# Patient Record
Sex: Male | Born: 1985
Health system: Southern US, Community
[De-identification: ages and names within clinical notes are randomized; demographics above are authoritative.]

## PROBLEM LIST (undated history)

## (undated) ENCOUNTER — Emergency Department (HOSPITAL_COMMUNITY): Admission: EM | Payer: Self-pay | Source: Home / Self Care

## (undated) DIAGNOSIS — F319 Bipolar disorder, unspecified: Secondary | ICD-10-CM

## (undated) DIAGNOSIS — J45909 Unspecified asthma, uncomplicated: Secondary | ICD-10-CM

## (undated) DIAGNOSIS — G4700 Insomnia, unspecified: Secondary | ICD-10-CM

## (undated) DIAGNOSIS — N179 Acute kidney failure, unspecified: Secondary | ICD-10-CM

## (undated) DIAGNOSIS — F988 Other specified behavioral and emotional disorders with onset usually occurring in childhood and adolescence: Secondary | ICD-10-CM

## (undated) HISTORY — PX: WISDOM TOOTH EXTRACTION: SHX21

---
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
## 2007-02-17 ENCOUNTER — Emergency Department (HOSPITAL_COMMUNITY): Admission: EM | Admit: 2007-02-17 | Discharge: 2007-02-18 | Payer: Self-pay | Admitting: Emergency Medicine

## 2007-03-01 ENCOUNTER — Emergency Department (HOSPITAL_COMMUNITY): Admission: EM | Admit: 2007-03-01 | Discharge: 2007-03-01 | Payer: Self-pay | Admitting: Emergency Medicine

## 2007-07-14 ENCOUNTER — Ambulatory Visit (HOSPITAL_COMMUNITY): Admission: RE | Admit: 2007-07-14 | Discharge: 2007-07-14 | Payer: Self-pay | Admitting: General Surgery

## 2007-12-27 ENCOUNTER — Emergency Department (HOSPITAL_COMMUNITY): Admission: EM | Admit: 2007-12-27 | Discharge: 2007-12-27 | Payer: Self-pay | Admitting: Emergency Medicine

## 2008-01-09 ENCOUNTER — Emergency Department (HOSPITAL_COMMUNITY): Admission: EM | Admit: 2008-01-09 | Discharge: 2008-01-09 | Payer: Self-pay | Admitting: Emergency Medicine

## 2008-02-19 ENCOUNTER — Emergency Department (HOSPITAL_COMMUNITY): Admission: EM | Admit: 2008-02-19 | Discharge: 2008-02-19 | Payer: Self-pay | Admitting: Emergency Medicine

## 2009-07-06 ENCOUNTER — Emergency Department (HOSPITAL_COMMUNITY): Admission: EM | Admit: 2009-07-06 | Discharge: 2009-07-07 | Payer: Self-pay | Admitting: Emergency Medicine

## 2009-09-14 ENCOUNTER — Emergency Department (HOSPITAL_COMMUNITY): Admission: EM | Admit: 2009-09-14 | Discharge: 2009-09-15 | Payer: Self-pay | Admitting: Emergency Medicine

## 2009-09-25 ENCOUNTER — Encounter: Admission: RE | Admit: 2009-09-25 | Discharge: 2009-09-25 | Payer: Self-pay | Admitting: Orthopaedic Surgery

## 2010-02-10 ENCOUNTER — Emergency Department (HOSPITAL_COMMUNITY): Admission: EM | Admit: 2010-02-10 | Discharge: 2010-02-10 | Payer: Self-pay | Admitting: Emergency Medicine

## 2011-01-20 LAB — GC/CHLAMYDIA PROBE AMP, GENITAL
Chlamydia, DNA Probe: POSITIVE — AB
GC Probe Amp, Genital: POSITIVE — AB

## 2011-01-20 LAB — RPR: RPR Ser Ql: NONREACTIVE

## 2011-05-13 DIAGNOSIS — J069 Acute upper respiratory infection, unspecified: Secondary | ICD-10-CM | POA: Diagnosis not present

## 2011-05-13 DIAGNOSIS — F17200 Nicotine dependence, unspecified, uncomplicated: Secondary | ICD-10-CM | POA: Diagnosis not present

## 2011-06-18 DIAGNOSIS — F3112 Bipolar disorder, current episode manic without psychotic features, moderate: Secondary | ICD-10-CM | POA: Diagnosis not present

## 2011-06-18 DIAGNOSIS — F5105 Insomnia due to other mental disorder: Secondary | ICD-10-CM | POA: Diagnosis not present

## 2011-06-18 DIAGNOSIS — F489 Nonpsychotic mental disorder, unspecified: Secondary | ICD-10-CM | POA: Diagnosis not present

## 2011-07-01 DIAGNOSIS — F3110 Bipolar disorder, current episode manic without psychotic features, unspecified: Secondary | ICD-10-CM | POA: Diagnosis not present

## 2011-07-27 DIAGNOSIS — F3110 Bipolar disorder, current episode manic without psychotic features, unspecified: Secondary | ICD-10-CM | POA: Diagnosis not present

## 2011-08-07 DIAGNOSIS — F3110 Bipolar disorder, current episode manic without psychotic features, unspecified: Secondary | ICD-10-CM | POA: Diagnosis not present

## 2011-08-28 DIAGNOSIS — F909 Attention-deficit hyperactivity disorder, unspecified type: Secondary | ICD-10-CM | POA: Diagnosis not present

## 2011-10-18 DIAGNOSIS — Z79899 Other long term (current) drug therapy: Secondary | ICD-10-CM | POA: Diagnosis not present

## 2011-10-18 DIAGNOSIS — S61409A Unspecified open wound of unspecified hand, initial encounter: Secondary | ICD-10-CM | POA: Diagnosis not present

## 2011-10-18 DIAGNOSIS — F17200 Nicotine dependence, unspecified, uncomplicated: Secondary | ICD-10-CM | POA: Diagnosis not present

## 2011-10-18 DIAGNOSIS — R7309 Other abnormal glucose: Secondary | ICD-10-CM | POA: Diagnosis not present

## 2011-10-18 DIAGNOSIS — Z23 Encounter for immunization: Secondary | ICD-10-CM | POA: Diagnosis not present

## 2011-11-01 DIAGNOSIS — Z4802 Encounter for removal of sutures: Secondary | ICD-10-CM | POA: Diagnosis not present

## 2012-01-06 DIAGNOSIS — F3110 Bipolar disorder, current episode manic without psychotic features, unspecified: Secondary | ICD-10-CM | POA: Diagnosis not present

## 2012-01-12 DIAGNOSIS — Z87891 Personal history of nicotine dependence: Secondary | ICD-10-CM | POA: Diagnosis not present

## 2012-01-12 DIAGNOSIS — R918 Other nonspecific abnormal finding of lung field: Secondary | ICD-10-CM | POA: Diagnosis not present

## 2012-01-12 DIAGNOSIS — R7309 Other abnormal glucose: Secondary | ICD-10-CM | POA: Diagnosis present

## 2012-01-12 DIAGNOSIS — F319 Bipolar disorder, unspecified: Secondary | ICD-10-CM | POA: Diagnosis present

## 2012-01-12 DIAGNOSIS — Z888 Allergy status to other drugs, medicaments and biological substances status: Secondary | ICD-10-CM | POA: Diagnosis not present

## 2012-01-12 DIAGNOSIS — R079 Chest pain, unspecified: Secondary | ICD-10-CM | POA: Diagnosis not present

## 2012-01-12 DIAGNOSIS — J180 Bronchopneumonia, unspecified organism: Secondary | ICD-10-CM | POA: Diagnosis not present

## 2012-01-12 DIAGNOSIS — R042 Hemoptysis: Secondary | ICD-10-CM | POA: Diagnosis not present

## 2012-01-12 DIAGNOSIS — I2699 Other pulmonary embolism without acute cor pulmonale: Secondary | ICD-10-CM | POA: Diagnosis not present

## 2012-01-12 DIAGNOSIS — Z79899 Other long term (current) drug therapy: Secondary | ICD-10-CM | POA: Diagnosis not present

## 2012-01-12 DIAGNOSIS — G4700 Insomnia, unspecified: Secondary | ICD-10-CM | POA: Diagnosis present

## 2012-01-12 DIAGNOSIS — F909 Attention-deficit hyperactivity disorder, unspecified type: Secondary | ICD-10-CM | POA: Diagnosis present

## 2012-01-12 DIAGNOSIS — J189 Pneumonia, unspecified organism: Secondary | ICD-10-CM | POA: Diagnosis present

## 2012-01-13 DIAGNOSIS — R918 Other nonspecific abnormal finding of lung field: Secondary | ICD-10-CM | POA: Diagnosis not present

## 2012-01-13 DIAGNOSIS — R042 Hemoptysis: Secondary | ICD-10-CM | POA: Diagnosis not present

## 2012-01-13 DIAGNOSIS — J180 Bronchopneumonia, unspecified organism: Secondary | ICD-10-CM | POA: Diagnosis not present

## 2012-01-13 DIAGNOSIS — R079 Chest pain, unspecified: Secondary | ICD-10-CM | POA: Diagnosis not present

## 2012-01-21 DIAGNOSIS — J159 Unspecified bacterial pneumonia: Secondary | ICD-10-CM | POA: Diagnosis not present

## 2012-03-11 DIAGNOSIS — Z79899 Other long term (current) drug therapy: Secondary | ICD-10-CM | POA: Diagnosis not present

## 2012-03-11 DIAGNOSIS — K089 Disorder of teeth and supporting structures, unspecified: Secondary | ICD-10-CM | POA: Diagnosis not present

## 2012-03-11 DIAGNOSIS — K029 Dental caries, unspecified: Secondary | ICD-10-CM | POA: Diagnosis not present

## 2012-03-11 DIAGNOSIS — F17200 Nicotine dependence, unspecified, uncomplicated: Secondary | ICD-10-CM | POA: Diagnosis not present

## 2012-03-14 DIAGNOSIS — F17200 Nicotine dependence, unspecified, uncomplicated: Secondary | ICD-10-CM | POA: Diagnosis not present

## 2012-03-14 DIAGNOSIS — I959 Hypotension, unspecified: Secondary | ICD-10-CM | POA: Diagnosis not present

## 2012-03-14 DIAGNOSIS — K5289 Other specified noninfective gastroenteritis and colitis: Secondary | ICD-10-CM | POA: Diagnosis not present

## 2012-03-14 DIAGNOSIS — F411 Generalized anxiety disorder: Secondary | ICD-10-CM | POA: Diagnosis not present

## 2012-03-14 DIAGNOSIS — F909 Attention-deficit hyperactivity disorder, unspecified type: Secondary | ICD-10-CM | POA: Diagnosis not present

## 2012-03-14 DIAGNOSIS — F209 Schizophrenia, unspecified: Secondary | ICD-10-CM | POA: Diagnosis not present

## 2012-03-14 DIAGNOSIS — D649 Anemia, unspecified: Secondary | ICD-10-CM | POA: Diagnosis not present

## 2012-03-14 DIAGNOSIS — F319 Bipolar disorder, unspecified: Secondary | ICD-10-CM | POA: Diagnosis not present

## 2012-03-14 DIAGNOSIS — R480 Dyslexia and alexia: Secondary | ICD-10-CM | POA: Diagnosis not present

## 2012-03-14 DIAGNOSIS — Z79899 Other long term (current) drug therapy: Secondary | ICD-10-CM | POA: Diagnosis not present

## 2012-03-14 DIAGNOSIS — I10 Essential (primary) hypertension: Secondary | ICD-10-CM | POA: Diagnosis not present

## 2012-03-14 DIAGNOSIS — E876 Hypokalemia: Secondary | ICD-10-CM | POA: Diagnosis not present

## 2012-03-14 DIAGNOSIS — R112 Nausea with vomiting, unspecified: Secondary | ICD-10-CM | POA: Diagnosis not present

## 2012-03-14 DIAGNOSIS — Z888 Allergy status to other drugs, medicaments and biological substances status: Secondary | ICD-10-CM | POA: Diagnosis not present

## 2012-03-14 DIAGNOSIS — N179 Acute kidney failure, unspecified: Secondary | ICD-10-CM | POA: Diagnosis not present

## 2012-03-14 DIAGNOSIS — R109 Unspecified abdominal pain: Secondary | ICD-10-CM | POA: Diagnosis not present

## 2012-03-14 DIAGNOSIS — Z841 Family history of disorders of kidney and ureter: Secondary | ICD-10-CM | POA: Diagnosis not present

## 2012-03-14 DIAGNOSIS — M549 Dorsalgia, unspecified: Secondary | ICD-10-CM | POA: Diagnosis not present

## 2012-03-15 DIAGNOSIS — K5289 Other specified noninfective gastroenteritis and colitis: Secondary | ICD-10-CM | POA: Diagnosis not present

## 2012-03-16 DIAGNOSIS — R112 Nausea with vomiting, unspecified: Secondary | ICD-10-CM | POA: Diagnosis not present

## 2012-03-16 DIAGNOSIS — K5289 Other specified noninfective gastroenteritis and colitis: Secondary | ICD-10-CM | POA: Diagnosis not present

## 2012-03-16 DIAGNOSIS — R110 Nausea: Secondary | ICD-10-CM | POA: Diagnosis not present

## 2012-03-16 DIAGNOSIS — N179 Acute kidney failure, unspecified: Secondary | ICD-10-CM | POA: Diagnosis not present

## 2012-03-16 DIAGNOSIS — F329 Major depressive disorder, single episode, unspecified: Secondary | ICD-10-CM | POA: Diagnosis not present

## 2012-03-17 DIAGNOSIS — R112 Nausea with vomiting, unspecified: Secondary | ICD-10-CM | POA: Diagnosis not present

## 2012-03-17 DIAGNOSIS — F329 Major depressive disorder, single episode, unspecified: Secondary | ICD-10-CM | POA: Diagnosis not present

## 2012-03-17 DIAGNOSIS — K5289 Other specified noninfective gastroenteritis and colitis: Secondary | ICD-10-CM | POA: Diagnosis not present

## 2012-03-17 DIAGNOSIS — N179 Acute kidney failure, unspecified: Secondary | ICD-10-CM | POA: Diagnosis not present

## 2012-03-18 DIAGNOSIS — F329 Major depressive disorder, single episode, unspecified: Secondary | ICD-10-CM | POA: Diagnosis not present

## 2012-03-18 DIAGNOSIS — N179 Acute kidney failure, unspecified: Secondary | ICD-10-CM | POA: Diagnosis not present

## 2012-03-18 DIAGNOSIS — K5289 Other specified noninfective gastroenteritis and colitis: Secondary | ICD-10-CM | POA: Diagnosis not present

## 2012-03-18 DIAGNOSIS — R112 Nausea with vomiting, unspecified: Secondary | ICD-10-CM | POA: Diagnosis not present

## 2012-07-03 ENCOUNTER — Encounter (HOSPITAL_COMMUNITY): Payer: Self-pay | Admitting: Emergency Medicine

## 2012-07-03 ENCOUNTER — Emergency Department (HOSPITAL_COMMUNITY)
Admission: EM | Admit: 2012-07-03 | Discharge: 2012-07-03 | Disposition: A | Discharge status: Home / Self Care | Payer: Medicare Other | Attending: Emergency Medicine | Admitting: Emergency Medicine

## 2012-07-03 ENCOUNTER — Emergency Department (HOSPITAL_COMMUNITY): Payer: Medicare Other

## 2012-07-03 DIAGNOSIS — R109 Unspecified abdominal pain: Secondary | ICD-10-CM | POA: Diagnosis not present

## 2012-07-03 DIAGNOSIS — R1110 Vomiting, unspecified: Secondary | ICD-10-CM | POA: Diagnosis not present

## 2012-07-03 DIAGNOSIS — R197 Diarrhea, unspecified: Secondary | ICD-10-CM | POA: Diagnosis not present

## 2012-07-03 DIAGNOSIS — R1031 Right lower quadrant pain: Secondary | ICD-10-CM | POA: Diagnosis not present

## 2012-07-03 DIAGNOSIS — F17200 Nicotine dependence, unspecified, uncomplicated: Secondary | ICD-10-CM | POA: Diagnosis not present

## 2012-07-03 DIAGNOSIS — J45909 Unspecified asthma, uncomplicated: Secondary | ICD-10-CM | POA: Insufficient documentation

## 2012-07-03 HISTORY — DX: Unspecified asthma, uncomplicated: J45.909

## 2012-07-03 LAB — URINALYSIS, ROUTINE W REFLEX MICROSCOPIC
Glucose, UA: NEGATIVE mg/dL
Hgb urine dipstick: NEGATIVE
Ketones, ur: NEGATIVE mg/dL
Leukocytes, UA: NEGATIVE
Nitrite: NEGATIVE
Protein, ur: NEGATIVE mg/dL
Specific Gravity, Urine: >1.03 — ABNORMAL HIGH (ref 1.005–1.030)
Urobilinogen, UA: 0.2 mg/dL (ref 0.0–1.0)
pH: 6 (ref 5.0–8.0)

## 2012-07-03 LAB — CBC WITH DIFFERENTIAL/PLATELET
Basophils Absolute: 0 10*3/uL (ref 0.0–0.1)
Basophils Relative: 0 % (ref 0–1)
Eosinophils Absolute: 0.1 10*3/uL (ref 0.0–0.7)
Eosinophils Relative: 2 % (ref 0–5)
HCT: 45.9 % (ref 39.0–52.0)
Hemoglobin: 15.4 g/dL (ref 13.0–17.0)
Lymphocytes Relative: 40 % (ref 12–46)
Lymphs Abs: 2 10*3/uL (ref 0.7–4.0)
MCH: 28.4 pg (ref 26.0–34.0)
MCHC: 33.6 g/dL (ref 30.0–36.0)
MCV: 84.7 fL (ref 78.0–100.0)
Monocytes Absolute: 0.4 10*3/uL (ref 0.1–1.0)
Monocytes Relative: 8 % (ref 3–12)
Neutro Abs: 2.5 10*3/uL (ref 1.7–7.7)
Neutrophils Relative %: 50 % (ref 43–77)
Platelets: 196 10*3/uL (ref 150–400)
RBC: 5.42 MIL/uL (ref 4.22–5.81)
RDW: 13 % (ref 11.5–15.5)
WBC: 5 10*3/uL (ref 4.0–10.5)

## 2012-07-03 LAB — LIPASE, BLOOD: Lipase: 13 U/L (ref 11–59)

## 2012-07-03 LAB — BASIC METABOLIC PANEL
BUN: 8 mg/dL (ref 6–23)
CO2: 28 mEq/L (ref 19–32)
Calcium: 9.6 mg/dL (ref 8.4–10.5)
Chloride: 103 mEq/L (ref 96–112)
Creatinine, Ser: 1.04 mg/dL (ref 0.50–1.35)
GFR calc Af Amer: >90 mL/min (ref >90)
GFR calc non Af Amer: >90 mL/min (ref >90)
Glucose, Bld: 86 mg/dL (ref 70–99)
Potassium: 4.4 mEq/L (ref 3.5–5.1)
Sodium: 138 mEq/L (ref 135–145)

## 2012-07-03 MED ORDER — IOHEXOL 300 MG/ML  SOLN
100.0000 mL | Freq: Once | INTRAMUSCULAR | Status: AC | PRN
  Administered 2012-07-03: 100 mL via INTRAVENOUS

## 2012-07-03 MED ORDER — ONDANSETRON HCL 4 MG/2ML IJ SOLN
4.0000 mg | Freq: Once | INTRAMUSCULAR | Status: AC
  Administered 2012-07-03: 4 mg via INTRAVENOUS
  Filled 2012-07-03: qty 2

## 2012-07-03 MED ORDER — HYDROMORPHONE HCL PF 1 MG/ML IJ SOLN
1.0000 mg | Freq: Once | INTRAMUSCULAR | Status: AC
  Administered 2012-07-03: 1 mg via INTRAVENOUS
  Filled 2012-07-03: qty 1

## 2012-07-03 MED ORDER — SODIUM CHLORIDE 0.9 % IV BOLUS (SEPSIS)
1000.0000 mL | Freq: Once | INTRAVENOUS | Status: AC
  Administered 2012-07-03: 1000 mL via INTRAVENOUS

## 2012-07-03 MED ORDER — IOHEXOL 300 MG/ML  SOLN
50.0000 mL | Freq: Once | INTRAMUSCULAR | Status: AC | PRN
  Administered 2012-07-03: 50 mL via ORAL

## 2012-07-03 MED ORDER — TRAMADOL HCL 50 MG PO TABS: 50.0000 mg | ORAL_TABLET | Freq: Four times a day (QID) | ORAL | Status: DC | PRN

## 2012-07-03 NOTE — ED Notes (Signed)
Pt c/o abd pain with nausea. denies vomiting. Some diarrhea.

## 2012-07-07 NOTE — ED Provider Notes (Signed)
History    27 year old male with abdominal pain. Onset last night. Constant since. No appreciable exacerbating relieving factors. Pain is from the umbilicus. Does not radiate. Nausea, but no vomiting. Some loose stools. No blood or melena. No sick contacts. History of asthma, otherwise healthy. No past surgical history. No intervention prior to arrival.  CSN: [id number]  Arrival date & time 07/03/12  1132   First MD Initiated Contact with Patient 07/03/12 1412      Chief Complaint  Patient presents with  . Abdominal Pain    (Consider location/radiation/quality/duration/timing/severity/associated sxs/prior treatment) HPI  Past Medical History  Diagnosis Date  . Asthma     Past Surgical History  Procedure Laterality Date  . Wisdom tooth extraction      History reviewed. No pertinent family history.  History  Substance Use Topics  . Smoking status: Current Every Day Smoker  . Smokeless tobacco: Not on file  . Alcohol Use: No      Review of Systems  All systems reviewed and negative, other than as noted in HPI.   Allergies  Fish allergy and Penicillins  Home Medications   Current Outpatient Rx  Name  Route  Sig  Dispense  Refill  . traMADol (ULTRAM) 50 MG tablet   Oral   Take 1 tablet (50 mg total) by mouth every 6 (six) hours as needed for pain.   15 tablet   0     BP 107/71  Pulse 52  Temp(Src) 97.5 F (36.4 C) (Oral)  Resp 14  Ht 6' 2.25" (1.886 m)  Wt 236 lb 4 oz (107.162 kg)  BMI 30.13 kg/m2  SpO2 100%  Physical Exam  Nursing note and vitals reviewed. Constitutional: He appears well-developed and well-nourished. No distress.  HENT:  Head: Normocephalic and atraumatic.  Eyes: Conjunctivae are normal. Right eye exhibits no discharge. Left eye exhibits no discharge.  Neck: Neck supple.  Cardiovascular: Normal rate, regular rhythm and normal heart sounds.  Exam reveals no gallop and no friction rub.   No murmur heard. Pulmonary/Chest: Effort  normal and breath sounds normal. No respiratory distress.  Abdominal: Soft. He exhibits no distension and no mass. There is tenderness. There is no rebound and no guarding.  Right lower quadrant/suprapubic tenderness without rebound or guarding. No distention.  Genitourinary:  No costovertebral angle tenderness  Musculoskeletal: He exhibits no edema and no tenderness.  Neurological: He is alert.  Skin: Skin is warm and dry.  Psychiatric: He has a normal mood and affect. His behavior is normal. Thought content normal.    ED Course  Procedures (including critical care time)  Labs Reviewed  URINALYSIS, ROUTINE W REFLEX MICROSCOPIC - Abnormal; Notable for the following:    APPearance HAZY (*)    Specific Gravity, Urine >1.030 (*)    Bilirubin Urine SMALL (*)    All other components within normal limits  CBC WITH DIFFERENTIAL  BASIC METABOLIC PANEL  LIPASE, BLOOD   No results found.  Ct Abdomen Pelvis W Contrast  07/03/2012  *RADIOLOGY REPORT*  Clinical Data: Abdominal pain.  Vomiting.  CT ABDOMEN AND PELVIS WITH CONTRAST  Technique:  Multidetector CT imaging of the abdomen and pelvis was performed following the standard protocol during bolus administration of intravenous contrast.  Contrast: 100 ml Omnipaque-300  Comparison: None.  Findings: The abdominal parenchymal organs are normal in appearance.  Gallbladder is unremarkable.  No evidence of biliary dilatation.  No evidence of hydronephrosis.  No soft tissue masses or lymphadenopathy identified within the abdomen  or pelvis.  No evidence of inflammatory process or abnormal fluid collections. Normal appendix is visualized.  No evidence of bowel wall thickening, dilatation, or hernia.  IMPRESSION: Negative.  No acute findings or other significant abnormality identified.   Original Report Authenticated By: Myles Rosenthal, M.D.    1. Abdominal pain       MDM  27 year old male with abdominal pain. Workup was unremarkable including CT abdomen  and pelvis. Etiology not clear, but I have a low suspicion for emergent process. He is hemodynamically stable. Feels safe for discharge at this time. Return precautions discussed. Outpatient followup otherwise.        Raeford Razor, MD 07/07/12 [phone]

## 2012-08-07 DIAGNOSIS — T148XXA Other injury of unspecified body region, initial encounter: Secondary | ICD-10-CM | POA: Diagnosis not present

## 2012-08-07 DIAGNOSIS — M79609 Pain in unspecified limb: Secondary | ICD-10-CM | POA: Diagnosis not present

## 2012-08-07 DIAGNOSIS — S199XXA Unspecified injury of neck, initial encounter: Secondary | ICD-10-CM | POA: Diagnosis not present

## 2012-08-07 DIAGNOSIS — S139XXA Sprain of joints and ligaments of unspecified parts of neck, initial encounter: Secondary | ICD-10-CM | POA: Diagnosis not present

## 2012-08-07 DIAGNOSIS — Z79899 Other long term (current) drug therapy: Secondary | ICD-10-CM | POA: Diagnosis not present

## 2012-08-07 DIAGNOSIS — S0990XA Unspecified injury of head, initial encounter: Secondary | ICD-10-CM | POA: Diagnosis not present

## 2012-08-07 DIAGNOSIS — M25519 Pain in unspecified shoulder: Secondary | ICD-10-CM | POA: Diagnosis not present

## 2012-08-07 DIAGNOSIS — F17200 Nicotine dependence, unspecified, uncomplicated: Secondary | ICD-10-CM | POA: Diagnosis not present

## 2012-08-07 DIAGNOSIS — S3981XA Other specified injuries of abdomen, initial encounter: Secondary | ICD-10-CM | POA: Diagnosis not present

## 2012-08-07 DIAGNOSIS — S298XXA Other specified injuries of thorax, initial encounter: Secondary | ICD-10-CM | POA: Diagnosis not present

## 2012-08-07 DIAGNOSIS — S0993XA Unspecified injury of face, initial encounter: Secondary | ICD-10-CM | POA: Diagnosis not present

## 2012-09-05 DIAGNOSIS — M25519 Pain in unspecified shoulder: Secondary | ICD-10-CM | POA: Diagnosis not present

## 2012-11-25 DIAGNOSIS — A5903 Trichomonal cystitis and urethritis: Secondary | ICD-10-CM | POA: Diagnosis not present

## 2012-11-30 ENCOUNTER — Encounter: Payer: Self-pay | Admitting: Orthopedic Surgery

## 2012-11-30 ENCOUNTER — Ambulatory Visit (INDEPENDENT_AMBULATORY_CARE_PROVIDER_SITE_OTHER): Payer: Medicare Other | Admitting: Orthopedic Surgery

## 2012-11-30 VITALS — BP 107/75 | Ht 74.0 in | Wt 251.0 lb

## 2012-11-30 DIAGNOSIS — M542 Cervicalgia: Secondary | ICD-10-CM | POA: Diagnosis not present

## 2012-11-30 DIAGNOSIS — M75120 Complete rotator cuff tear or rupture of unspecified shoulder, not specified as traumatic: Secondary | ICD-10-CM

## 2012-11-30 DIAGNOSIS — M75122 Complete rotator cuff tear or rupture of left shoulder, not specified as traumatic: Secondary | ICD-10-CM

## 2012-11-30 MED ORDER — HYDROCODONE-ACETAMINOPHEN 5-325 MG PO TABS: 1.0000 | ORAL_TABLET | ORAL | Status: DC | PRN

## 2012-11-30 MED ORDER — IBUPROFEN 800 MG PO TABS: 800.0000 mg | ORAL_TABLET | Freq: Three times a day (TID) | ORAL | Status: DC

## 2012-11-30 NOTE — Patient Instructions (Addendum)
MRI left shoulder followup in 3 weeks take medications as ordered
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
## 2012-12-08 ENCOUNTER — Telehealth: Payer: Self-pay | Admitting: *Deleted

## 2012-12-08 DIAGNOSIS — F3132 Bipolar disorder, current episode depressed, moderate: Secondary | ICD-10-CM | POA: Diagnosis not present

## 2012-12-08 NOTE — Telephone Encounter (Signed)
MRI scheduled for 12/14/12 at 2:00 pm No precert required per Medicare guidelines and No precert required per Layla Maw with [place] [employer] NPI # for [facility] Dr. Felecia Shelling [id number] Follow up appointment with Dr. Romeo Apple is 12/22/12 at 4:30pm Dates and times of MRI given to his mother Gregory Cox

## 2012-12-14 ENCOUNTER — Ambulatory Visit (HOSPITAL_COMMUNITY): Payer: Medicare Other

## 2012-12-15 ENCOUNTER — Emergency Department (HOSPITAL_COMMUNITY): Payer: Medicare Other

## 2012-12-15 ENCOUNTER — Encounter (HOSPITAL_COMMUNITY): Payer: Self-pay

## 2012-12-15 ENCOUNTER — Emergency Department (HOSPITAL_COMMUNITY)
Admission: EM | Admit: 2012-12-15 | Discharge: 2012-12-15 | Disposition: A | Discharge status: Home / Self Care | Payer: Medicare Other | Attending: Emergency Medicine | Admitting: Emergency Medicine

## 2012-12-15 DIAGNOSIS — J45909 Unspecified asthma, uncomplicated: Secondary | ICD-10-CM | POA: Insufficient documentation

## 2012-12-15 DIAGNOSIS — F17200 Nicotine dependence, unspecified, uncomplicated: Secondary | ICD-10-CM | POA: Insufficient documentation

## 2012-12-15 DIAGNOSIS — Z880 Allergy status to penicillin: Secondary | ICD-10-CM | POA: Insufficient documentation

## 2012-12-15 DIAGNOSIS — S93409A Sprain of unspecified ligament of unspecified ankle, initial encounter: Secondary | ICD-10-CM | POA: Diagnosis not present

## 2012-12-15 DIAGNOSIS — X500XXA Overexertion from strenuous movement or load, initial encounter: Secondary | ICD-10-CM | POA: Insufficient documentation

## 2012-12-15 DIAGNOSIS — M7989 Other specified soft tissue disorders: Secondary | ICD-10-CM | POA: Diagnosis not present

## 2012-12-15 DIAGNOSIS — Z791 Long term (current) use of non-steroidal anti-inflammatories (NSAID): Secondary | ICD-10-CM | POA: Insufficient documentation

## 2012-12-15 DIAGNOSIS — F988 Other specified behavioral and emotional disorders with onset usually occurring in childhood and adolescence: Secondary | ICD-10-CM | POA: Insufficient documentation

## 2012-12-15 DIAGNOSIS — S93401A Sprain of unspecified ligament of right ankle, initial encounter: Secondary | ICD-10-CM

## 2012-12-15 DIAGNOSIS — Z79899 Other long term (current) drug therapy: Secondary | ICD-10-CM | POA: Diagnosis not present

## 2012-12-15 DIAGNOSIS — M25579 Pain in unspecified ankle and joints of unspecified foot: Secondary | ICD-10-CM | POA: Diagnosis not present

## 2012-12-15 DIAGNOSIS — Y9239 Other specified sports and athletic area as the place of occurrence of the external cause: Secondary | ICD-10-CM | POA: Insufficient documentation

## 2012-12-15 DIAGNOSIS — Y9367 Activity, basketball: Secondary | ICD-10-CM | POA: Insufficient documentation

## 2012-12-15 HISTORY — DX: Other specified behavioral and emotional disorders with onset usually occurring in childhood and adolescence: F98.8

## 2012-12-15 MED ORDER — IBUPROFEN 800 MG PO TABS
800.0000 mg | ORAL_TABLET | Freq: Once | ORAL | Status: AC
  Administered 2012-12-15: 800 mg via ORAL
  Filled 2012-12-15: qty 1

## 2012-12-15 NOTE — ED Provider Notes (Signed)
CSN: [id number]     Arrival date & time 12/15/12  0033 History   First MD Initiated Contact with Patient 12/15/12 0045     Chief Complaint - ankle pain  Patient is a 27 y.o. male presenting with ankle pain. The history is provided by the patient.  Ankle Pain Location:  Ankle Ankle location:  R ankle Pain details:    Quality:  Aching   Radiates to:  Does not radiate   Severity:  Moderate   Onset quality:  Sudden   Duration:  1 hour   Timing:  Constant   Progression:  Worsening Relieved by:  Rest Worsened by:  Activity Associated symptoms: no back pain     Past Medical History  Diagnosis Date  . Asthma   . Attention deficit disorder (ADD)    Past Surgical History  Procedure Laterality Date  . Wisdom tooth extraction     No family history on file. History  Substance Use Topics  . Smoking status: Current Every Day Smoker  . Smokeless tobacco: Not on file  . Alcohol Use: No    Review of Systems  Musculoskeletal: Positive for arthralgias. Negative for back pain.  Neurological: Negative for weakness.    Allergies  Fish allergy and Penicillins  Home Medications   Current Outpatient Rx  Name  Route  Sig  Dispense  Refill  . atomoxetine (STRATTERA) 10 MG capsule   Oral   Take 10 mg by mouth daily.         Marland Kitchen HYDROcodone-acetaminophen (NORCO/VICODIN) 5-325 MG per tablet   Oral   Take 1 tablet by mouth every 4 (four) hours as needed for pain.   42 tablet   3   . traMADol (ULTRAM) 50 MG tablet   Oral   Take 1 tablet (50 mg total) by mouth every 6 (six) hours as needed for pain.   15 tablet   0   . traZODone (DESYREL) 100 MG tablet   Oral   Take 100 mg by mouth at bedtime.         . ARIPiprazole (ABILIFY) 10 MG tablet   Oral   Take 10 mg by mouth daily.         Marland Kitchen ibuprofen (ADVIL,MOTRIN) 800 MG tablet   Oral   Take 1 tablet (800 mg total) by mouth 3 (three) times daily.   90 tablet   0    BP 127/66  Pulse 64  Temp(Src) 97.7 F (36.5 C) (Oral)   Resp 20  Ht 6\' 2"  (1.88 m)  Wt 289 lb (131.09 kg)  BMI 37.09 kg/m2  SpO2 100% Physical Exam CONSTITUTIONAL: Well developed/well nourished HEAD: Normocephalic/atraumatic EYES: EOMI/PERRL ENMT: Mucous membranes moist NECK: supple no meningeal signs SPINE:entire spine nontender CV: S1/S2 noted, no murmurs/rubs/gallops noted LUNGS: Lungs are clear to auscultation bilaterally, no apparent distress ABDOMEN: soft, nontender, no rebound or guarding NEURO: Pt is awake/alert, moves all extremitiesx4 EXTREMITIES: pulses normal, full ROM Tenderness to right lateral malleolus.  No lacerations noted.  No distal right foot tenderness Right achilles intact.  No right prox fib tenderness SKIN: warm, color normal PSYCH: no abnormalities of mood noted  ED Course  Procedures (including critical care time) Labs Review Labs Reviewed - No data to display Imaging Review Dg Ankle Complete Right  12/15/2012   *RADIOLOGY REPORT*  Clinical Data: Right ankle pain, now with lateral ankle swelling  RIGHT ANKLE - COMPLETE 3+ VIEW  Comparison: None.  Findings:  There is mild soft tissue swelling  about the lateral malleolus. This finding is without associated fracture or dislocation.  The ankle mortise is preserved.  No definite joint effusion.  No radiopaque foreign body.  IMPRESSION: Mild soft tissue swelling about the lateral malleolus without associated fracture.   Original Report Authenticated By: Tacey Ruiz, MD    MDM   1. Right ankle sprain, initial encounter    Nursing notes including past medical history and social history reviewed and considered in documentation xrays reviewed and considered  Pt already has pain meds at home Given ASO/crutches and ortho referral     Joya Gaskins, MD 12/15/12 0236

## 2012-12-15 NOTE — ED Notes (Signed)
Pt came down on right foot wrong while playing basketball tonight.  Swelling to lateral malleolus

## 2012-12-20 ENCOUNTER — Ambulatory Visit (HOSPITAL_COMMUNITY)
Admission: RE | Admit: 2012-12-20 | Discharge: 2012-12-20 | Disposition: A | Discharge status: Home / Self Care | Payer: Medicare Other | Source: Ambulatory Visit | Attending: Orthopedic Surgery | Admitting: Orthopedic Surgery

## 2012-12-20 DIAGNOSIS — M25519 Pain in unspecified shoulder: Secondary | ICD-10-CM | POA: Insufficient documentation

## 2012-12-20 DIAGNOSIS — M542 Cervicalgia: Secondary | ICD-10-CM

## 2012-12-20 DIAGNOSIS — M67919 Unspecified disorder of synovium and tendon, unspecified shoulder: Secondary | ICD-10-CM | POA: Diagnosis not present

## 2012-12-20 DIAGNOSIS — M75122 Complete rotator cuff tear or rupture of left shoulder, not specified as traumatic: Secondary | ICD-10-CM

## 2012-12-20 DIAGNOSIS — M719 Bursopathy, unspecified: Secondary | ICD-10-CM | POA: Insufficient documentation

## 2012-12-22 ENCOUNTER — Ambulatory Visit (INDEPENDENT_AMBULATORY_CARE_PROVIDER_SITE_OTHER): Payer: Medicare Other | Admitting: Orthopedic Surgery

## 2012-12-22 ENCOUNTER — Encounter: Payer: Self-pay | Admitting: Orthopedic Surgery

## 2012-12-22 VITALS — BP 127/79 | Ht 74.0 in | Wt 251.0 lb

## 2012-12-22 DIAGNOSIS — M75122 Complete rotator cuff tear or rupture of left shoulder, not specified as traumatic: Secondary | ICD-10-CM

## 2012-12-22 DIAGNOSIS — M25519 Pain in unspecified shoulder: Secondary | ICD-10-CM | POA: Diagnosis not present

## 2012-12-22 DIAGNOSIS — S93409A Sprain of unspecified ligament of unspecified ankle, initial encounter: Secondary | ICD-10-CM | POA: Insufficient documentation

## 2012-12-22 DIAGNOSIS — M25512 Pain in left shoulder: Secondary | ICD-10-CM

## 2012-12-22 DIAGNOSIS — M75120 Complete rotator cuff tear or rupture of unspecified shoulder, not specified as traumatic: Secondary | ICD-10-CM | POA: Diagnosis not present

## 2012-12-22 DIAGNOSIS — Z5189 Encounter for other specified aftercare: Secondary | ICD-10-CM | POA: Diagnosis not present

## 2012-12-22 DIAGNOSIS — S93401D Sprain of unspecified ligament of right ankle, subsequent encounter: Secondary | ICD-10-CM

## 2012-12-22 NOTE — Patient Instructions (Addendum)
Therapy   Left shoulder and right ankle
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
## 2012-12-27 ENCOUNTER — Inpatient Hospital Stay (HOSPITAL_COMMUNITY): Admission: RE | Admit: 2012-12-27 | Payer: Medicare Other | Source: Ambulatory Visit | Admitting: Physical Therapy

## 2012-12-31 ENCOUNTER — Emergency Department (HOSPITAL_COMMUNITY)
Admission: EM | Admit: 2012-12-31 | Discharge: 2012-12-31 | Disposition: A | Discharge status: Home / Self Care | Payer: Medicare Other | Attending: Emergency Medicine | Admitting: Emergency Medicine

## 2012-12-31 ENCOUNTER — Encounter (HOSPITAL_COMMUNITY): Payer: Self-pay | Admitting: *Deleted

## 2012-12-31 DIAGNOSIS — Z79899 Other long term (current) drug therapy: Secondary | ICD-10-CM | POA: Insufficient documentation

## 2012-12-31 DIAGNOSIS — Z880 Allergy status to penicillin: Secondary | ICD-10-CM | POA: Insufficient documentation

## 2012-12-31 DIAGNOSIS — J45909 Unspecified asthma, uncomplicated: Secondary | ICD-10-CM | POA: Diagnosis not present

## 2012-12-31 DIAGNOSIS — F17200 Nicotine dependence, unspecified, uncomplicated: Secondary | ICD-10-CM | POA: Insufficient documentation

## 2012-12-31 DIAGNOSIS — F319 Bipolar disorder, unspecified: Secondary | ICD-10-CM | POA: Diagnosis not present

## 2012-12-31 DIAGNOSIS — S0550XA Penetrating wound with foreign body of unspecified eyeball, initial encounter: Secondary | ICD-10-CM | POA: Diagnosis not present

## 2012-12-31 DIAGNOSIS — F988 Other specified behavioral and emotional disorders with onset usually occurring in childhood and adolescence: Secondary | ICD-10-CM | POA: Diagnosis not present

## 2012-12-31 DIAGNOSIS — Y93H2 Activity, gardening and landscaping: Secondary | ICD-10-CM | POA: Insufficient documentation

## 2012-12-31 DIAGNOSIS — T1590XA Foreign body on external eye, part unspecified, unspecified eye, initial encounter: Secondary | ICD-10-CM | POA: Insufficient documentation

## 2012-12-31 DIAGNOSIS — H18891 Other specified disorders of cornea, right eye: Secondary | ICD-10-CM

## 2012-12-31 DIAGNOSIS — H579 Unspecified disorder of eye and adnexa: Secondary | ICD-10-CM | POA: Diagnosis not present

## 2012-12-31 DIAGNOSIS — Y9289 Other specified places as the place of occurrence of the external cause: Secondary | ICD-10-CM | POA: Insufficient documentation

## 2012-12-31 HISTORY — DX: Bipolar disorder, unspecified: F31.9

## 2012-12-31 MED ORDER — TETRACAINE HCL 0.5 % OP SOLN
OPHTHALMIC | Status: AC
  Administered 2012-12-31: 2 [drp] via OPHTHALMIC
  Filled 2012-12-31: qty 2

## 2012-12-31 MED ORDER — TETRACAINE HCL 0.5 % OP SOLN
2.0000 [drp] | Freq: Once | OPHTHALMIC | Status: AC
  Administered 2012-12-31: 2 [drp] via OPHTHALMIC

## 2012-12-31 MED ORDER — HYDROCODONE-ACETAMINOPHEN 5-325 MG PO TABS: 1.0000 | ORAL_TABLET | ORAL | Status: DC | PRN

## 2012-12-31 MED ORDER — TOBRAMYCIN 0.3 % OP SOLN
2.0000 [drp] | OPHTHALMIC | Status: DC
  Administered 2012-12-31: 2 [drp] via OPHTHALMIC
  Filled 2012-12-31: qty 5

## 2012-12-31 MED ORDER — FLUORESCEIN SODIUM 1 MG OP STRP
ORAL_STRIP | OPHTHALMIC | Status: AC
  Administered 2012-12-31: 14:00:00
  Filled 2012-12-31: qty 1
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
## 2012-12-31 NOTE — ED Notes (Signed)
NP at bedside.

## 2013-01-01 NOTE — ED Provider Notes (Signed)
Medical screening examination/treatment/procedure(s) were performed by non-physician practitioner and as supervising physician I was immediately available for consultation/collaboration.    Celene Kras, MD 01/01/13 [phone]

## 2013-02-02 ENCOUNTER — Encounter: Payer: Self-pay | Admitting: Orthopedic Surgery

## 2013-02-02 ENCOUNTER — Ambulatory Visit: Payer: Medicare Other | Admitting: Orthopedic Surgery

## 2013-02-10 DIAGNOSIS — F3132 Bipolar disorder, current episode depressed, moderate: Secondary | ICD-10-CM | POA: Diagnosis not present

## 2013-02-23 DIAGNOSIS — F3110 Bipolar disorder, current episode manic without psychotic features, unspecified: Secondary | ICD-10-CM | POA: Diagnosis not present

## 2013-06-13 ENCOUNTER — Encounter (HOSPITAL_COMMUNITY): Payer: Self-pay | Admitting: Emergency Medicine

## 2013-06-13 ENCOUNTER — Emergency Department (HOSPITAL_COMMUNITY)
Admission: EM | Admit: 2013-06-13 | Discharge: 2013-06-13 | Disposition: A | Discharge status: Home / Self Care | Payer: Medicare Other | Attending: Emergency Medicine | Admitting: Emergency Medicine

## 2013-06-13 ENCOUNTER — Emergency Department (HOSPITAL_COMMUNITY): Payer: Medicare Other

## 2013-06-13 DIAGNOSIS — R112 Nausea with vomiting, unspecified: Secondary | ICD-10-CM | POA: Diagnosis not present

## 2013-06-13 DIAGNOSIS — F17200 Nicotine dependence, unspecified, uncomplicated: Secondary | ICD-10-CM | POA: Insufficient documentation

## 2013-06-13 DIAGNOSIS — Z79899 Other long term (current) drug therapy: Secondary | ICD-10-CM | POA: Diagnosis not present

## 2013-06-13 DIAGNOSIS — Z880 Allergy status to penicillin: Secondary | ICD-10-CM | POA: Diagnosis not present

## 2013-06-13 DIAGNOSIS — Z87440 Personal history of urinary (tract) infections: Secondary | ICD-10-CM | POA: Diagnosis not present

## 2013-06-13 DIAGNOSIS — J45909 Unspecified asthma, uncomplicated: Secondary | ICD-10-CM | POA: Insufficient documentation

## 2013-06-13 DIAGNOSIS — R509 Fever, unspecified: Secondary | ICD-10-CM | POA: Diagnosis not present

## 2013-06-13 DIAGNOSIS — F319 Bipolar disorder, unspecified: Secondary | ICD-10-CM | POA: Insufficient documentation

## 2013-06-13 DIAGNOSIS — F988 Other specified behavioral and emotional disorders with onset usually occurring in childhood and adolescence: Secondary | ICD-10-CM | POA: Insufficient documentation

## 2013-06-13 DIAGNOSIS — R109 Unspecified abdominal pain: Secondary | ICD-10-CM

## 2013-06-13 DIAGNOSIS — R1031 Right lower quadrant pain: Secondary | ICD-10-CM | POA: Insufficient documentation

## 2013-06-13 LAB — COMPREHENSIVE METABOLIC PANEL
ALT: 17 U/L (ref 0–53)
AST: 20 U/L (ref 0–37)
Albumin: 4.2 g/dL (ref 3.5–5.2)
Alkaline Phosphatase: 115 U/L (ref 39–117)
BUN: 11 mg/dL (ref 6–23)
CO2: 26 mEq/L (ref 19–32)
Calcium: 9.5 mg/dL (ref 8.4–10.5)
Chloride: 102 mEq/L (ref 96–112)
Creatinine, Ser: 0.95 mg/dL (ref 0.50–1.35)
GFR calc Af Amer: >90 mL/min (ref >90)
GFR calc non Af Amer: >90 mL/min (ref >90)
Glucose, Bld: 86 mg/dL (ref 70–99)
Potassium: 4.2 mEq/L (ref 3.7–5.3)
Sodium: 139 mEq/L (ref 137–147)
Total Bilirubin: 0.6 mg/dL (ref 0.3–1.2)
Total Protein: 8 g/dL (ref 6.0–8.3)

## 2013-06-13 LAB — CBC WITH DIFFERENTIAL/PLATELET
Basophils Absolute: 0 10*3/uL (ref 0.0–0.1)
Basophils Relative: 0 % (ref 0–1)
Eosinophils Absolute: 0 10*3/uL (ref 0.0–0.7)
Eosinophils Relative: 0 % (ref 0–5)
HCT: 48 % (ref 39.0–52.0)
Hemoglobin: 15.6 g/dL (ref 13.0–17.0)
Lymphocytes Relative: 9 % — ABNORMAL LOW (ref 12–46)
Lymphs Abs: 0.6 10*3/uL — ABNORMAL LOW (ref 0.7–4.0)
MCH: 27.9 pg (ref 26.0–34.0)
MCHC: 32.5 g/dL (ref 30.0–36.0)
MCV: 85.7 fL (ref 78.0–100.0)
Monocytes Absolute: 0.3 10*3/uL (ref 0.1–1.0)
Monocytes Relative: 5 % (ref 3–12)
Neutro Abs: 5.6 10*3/uL (ref 1.7–7.7)
Neutrophils Relative %: 86 % — ABNORMAL HIGH (ref 43–77)
Platelets: 170 10*3/uL (ref 150–400)
RBC: 5.6 MIL/uL (ref 4.22–5.81)
RDW: 13.1 % (ref 11.5–15.5)
WBC: 6.5 10*3/uL (ref 4.0–10.5)

## 2013-06-13 LAB — URINALYSIS, ROUTINE W REFLEX MICROSCOPIC
Bilirubin Urine: NEGATIVE
Glucose, UA: NEGATIVE mg/dL
Hgb urine dipstick: NEGATIVE
Ketones, ur: NEGATIVE mg/dL
Leukocytes, UA: NEGATIVE
Nitrite: NEGATIVE
Protein, ur: NEGATIVE mg/dL
Specific Gravity, Urine: 1.015 (ref 1.005–1.030)
Urobilinogen, UA: 0.2 mg/dL (ref 0.0–1.0)
pH: 6.5 (ref 5.0–8.0)

## 2013-06-13 LAB — LIPASE, BLOOD: Lipase: 17 U/L (ref 11–59)

## 2013-06-13 MED ORDER — IOHEXOL 300 MG/ML  SOLN
100.0000 mL | Freq: Once | INTRAMUSCULAR | Status: AC | PRN
  Administered 2013-06-13: 100 mL via INTRAVENOUS

## 2013-06-13 MED ORDER — SODIUM CHLORIDE 0.9 % IJ SOLN
INTRAMUSCULAR | Status: AC
  Filled 2013-06-13: qty 500

## 2013-06-13 MED ORDER — ONDANSETRON HCL 4 MG/2ML IJ SOLN
4.0000 mg | Freq: Once | INTRAMUSCULAR | Status: AC
  Administered 2013-06-13: 4 mg via INTRAVENOUS
  Filled 2013-06-13: qty 2

## 2013-06-13 MED ORDER — MORPHINE SULFATE 4 MG/ML IJ SOLN
4.0000 mg | Freq: Once | INTRAMUSCULAR | Status: AC
  Administered 2013-06-13: 4 mg via INTRAVENOUS
  Filled 2013-06-13: qty 1

## 2013-06-13 MED ORDER — SODIUM CHLORIDE 0.9 % IV BOLUS (SEPSIS)
1000.0000 mL | Freq: Once | INTRAVENOUS | Status: AC
  Administered 2013-06-13: 1000 mL via INTRAVENOUS

## 2013-06-13 MED ORDER — HYDROCODONE-ACETAMINOPHEN 5-325 MG PO TABS: 2.0000 | ORAL_TABLET | ORAL | Status: DC | PRN

## 2013-06-13 MED ORDER — ONDANSETRON HCL 4 MG PO TABS: 4.0000 mg | ORAL_TABLET | Freq: Four times a day (QID) | ORAL | Status: DC

## 2013-06-13 NOTE — ED Provider Notes (Signed)
CSN: [id number]     Arrival date & time 06/13/13  1705 History  This chart was scribed for Glynn Octave, MD by Leone Payor, ED Scribe. This patient was seen in room APA18/[facility] and the patient's care was started 5:16 PM.    Chief Complaint  Patient presents with  . Emesis      The history is provided by the patient. No language interpreter was used.    HPI Comments: Gregory Cox is a 28 y.o. male who presents to the Emergency Department complaining of 4 episodes of vomiting that began upon waking this morning. He also reports having associated non radiating RLQ pain and subjective fevers. He states deep breathing worsens the pain but nothing eases the pain. He reports having kidney infections in the past but states this pain is different. He denies diarrhea, dysuria, hematuria, testicular pain.    Past Medical History  Diagnosis Date  . Asthma   . Attention deficit disorder (ADD)   . Bipolar 1 disorder    Past Surgical History  Procedure Laterality Date  . Wisdom tooth extraction     History reviewed. No pertinent family history. History  Substance Use Topics  . Smoking status: Current Every Day Smoker -- 1.00 packs/day    Types: Cigarettes  . Smokeless tobacco: Not on file  . Alcohol Use: No    Review of Systems  A complete 10 system review of systems was obtained and all systems are negative except as noted in the HPI and PMH.    Allergies  Fish allergy and Penicillins  Home Medications   Current Outpatient Rx  Name  Route  Sig  Dispense  Refill  . ARIPiprazole (ABILIFY) 10 MG tablet   Oral   Take 10 mg by mouth 2 (two) times daily.         Marland Kitchen atomoxetine (STRATTERA) 10 MG capsule   Oral   Take 10 mg by mouth daily.         Marland Kitchen HYDROcodone-acetaminophen (NORCO/VICODIN) 5-325 MG per tablet   Oral   Take 2 tablets by mouth every 4 (four) hours as needed.   10 tablet   0   . ondansetron (ZOFRAN) 4 MG tablet   Oral   Take 1 tablet (4 mg total) by mouth  every 6 (six) hours.   12 tablet   0   . traZODone (DESYREL) 100 MG tablet   Oral   Take 100 mg by mouth at bedtime.          BP 109/62  Pulse 53  Temp(Src) 98.2 F (36.8 C) (Oral)  Resp 16  Ht 6\' 2"  (1.88 m)  Wt 289 lb (131.09 kg)  BMI 37.09 kg/m2  SpO2 99% Physical Exam  Nursing note and vitals reviewed. Constitutional: He is oriented to person, place, and time. He appears well-developed and well-nourished. No distress.  Uncomfortable appearing.   HENT:  Head: Normocephalic and atraumatic.  Mouth/Throat: Oropharynx is clear and moist.  Eyes: Conjunctivae are normal. Pupils are equal, round, and reactive to light.  Neck: Normal range of motion. Neck supple.  Cardiovascular: Normal rate, regular rhythm, normal heart sounds and intact distal pulses.   No murmur heard. Pulmonary/Chest: Effort normal and breath sounds normal. No respiratory distress. He has no wheezes. He has no rales. He exhibits no tenderness.  Abdominal: Soft. Bowel sounds are normal. He exhibits no distension. There is tenderness in the right lower quadrant. There is guarding. There is no CVA tenderness.  Genitourinary:  No testicular tenderness. No CVAT.   Musculoskeletal: Normal range of motion. He exhibits no edema.  Neurological: He is alert and oriented to person, place, and time. No cranial nerve deficit. He exhibits normal muscle tone. Coordination normal.  Skin: Skin is warm and dry.  Psychiatric: He has a normal mood and affect.    ED Course  Procedures (including critical care time)  DIAGNOSTIC STUDIES: Oxygen Saturation is 97% on RA, adequate by my interpretation.    COORDINATION OF CARE: 5:19 PM Discussed treatment plan with pt at bedside and pt agreed to plan.   Labs Review Labs Reviewed  CBC WITH DIFFERENTIAL - Abnormal; Notable for the following:    Neutrophils Relative % 86 (*)    Lymphocytes Relative 9 (*)    Lymphs Abs 0.6 (*)    All other components within normal limits   COMPREHENSIVE METABOLIC PANEL  LIPASE, BLOOD  URINALYSIS, ROUTINE W REFLEX MICROSCOPIC   Imaging Review Ct Abdomen Pelvis W Contrast  06/13/2013   CLINICAL DATA:  Right lower quadrant abdominal pain, fever, nausea and vomiting.  EXAM: CT ABDOMEN AND PELVIS WITH CONTRAST  TECHNIQUE: Multidetector CT imaging of the abdomen and pelvis was performed using the standard protocol following bolus administration of intravenous contrast.  CONTRAST:  100mL OMNIPAQUE IOHEXOL 300 MG/ML  SOLN  COMPARISON:  07/03/2012.  FINDINGS: Normal appearing liver, spleen, pancreas, gallbladder, adrenal glands, kidneys, urinary bladder and prostate gland. No gastrointestinal abnormalities or enlarged lymph nodes. Normal appendix. Minimal dependent atelectasis at the right lung base. Mild bilateral hip degenerative changes. Mild to moderate diffuse posterior disc bulging at the L4-5 and L5-S1 levels and mild diffuse posterior disc bulging at the L3-4 level.  IMPRESSION: No acute abnormality.  Normal appendix.   Electronically Signed   By: Gordan PaymentSteve  Reid M.D.   On: 06/13/2013 19:37    EKG Interpretation   None       MDM   Final diagnoses:  Abdominal pain  Nausea and vomiting   Nausea and vomiting onset this morning with right lower quadrant pain. No fever, diarrhea, urinary symptoms.  Will evaluate for appendicitis versus kidney stone.  Labs unremarkable. UA negative. We'll pursue a CT scan to evaluate for appendicitis.  CT scan shows normal appendix. Patient's pain has improved. He still has some right-sided abdominal pain. He's had no vomiting in the ED. He is tolerating by mouth.  Results discussed with patient. Possibility of early appendicitis persists. Patient instructed to return if his pain worsens or he continues to have any fever or vomiting.  I personally performed the services described in this documentation, which was scribed in my presence. The recorded information has been reviewed and is  accurate.   Glynn OctaveStephen Maddy Graham, MD 06/13/13 [phone]

## 2013-06-13 NOTE — ED Notes (Signed)
Vomiting , onset this am. Fever, No diarrhea.rt flank pain

## 2013-06-13 NOTE — ED Notes (Signed)
Gave patient ginger ale to drink as ordered and requested.  

## 2013-06-13 NOTE — Discharge Instructions (Signed)
Abdominal Pain, Adult Your appendix is normal on the CT scan today. Follow up with Dr. Felecia ShellingFanta.  There is a possibility that you could have early appendicitis so return to the ED if you develop worsening, pain, vomiting, fever or any other concerns Many things can cause abdominal pain. Usually, abdominal pain is not caused by a disease and will improve without treatment. It can often be observed and treated at home. Your health care provider will do a physical exam and possibly order blood tests and X-rays to help determine the seriousness of your pain. However, in many cases, more time must pass before a clear cause of the pain can be found. Before that point, your health care provider may not know if you need more testing or further treatment. HOME CARE INSTRUCTIONS  Monitor your abdominal pain for any changes. The following actions may help to alleviate any discomfort you are experiencing:  Only take over-the-counter or prescription medicines as directed by your health care provider.  Do not take laxatives unless directed to do so by your health care provider.  Try a clear liquid diet (broth, tea, or water) as directed by your health care provider. Slowly move to a bland diet as tolerated. SEEK MEDICAL CARE IF:  You have unexplained abdominal pain.  You have abdominal pain associated with nausea or diarrhea.  You have pain when you urinate or have a bowel movement.  You experience abdominal pain that wakes you in the night.  You have abdominal pain that is worsened or improved by eating food.  You have abdominal pain that is worsened with eating fatty foods. SEEK IMMEDIATE MEDICAL CARE IF:   Your pain does not go away within 2 hours.  You have a fever.  You keep throwing up (vomiting).  Your pain is felt only in portions of the abdomen, such as the right side or the left lower portion of the abdomen.  You pass bloody or black tarry stools. MAKE SURE YOU:  Understand these  instructions.   Will watch your condition.   Will get help right away if you are not doing well or get worse.  Document Released: 01/14/2005 Document Revised: 01/25/2013 Document Reviewed: 12/14/2012 [facility] Patient Information 2014 [place], [place].

## 2013-06-16 DIAGNOSIS — H00029 Hordeolum internum unspecified eye, unspecified eyelid: Secondary | ICD-10-CM | POA: Diagnosis not present

## 2013-06-17 ENCOUNTER — Encounter (HOSPITAL_COMMUNITY): Payer: Self-pay | Admitting: Emergency Medicine

## 2013-06-17 ENCOUNTER — Emergency Department (HOSPITAL_COMMUNITY)
Admission: EM | Admit: 2013-06-17 | Discharge: 2013-06-17 | Disposition: A | Discharge status: Home / Self Care | Payer: Medicare Other | Attending: Emergency Medicine | Admitting: Emergency Medicine

## 2013-06-17 DIAGNOSIS — F319 Bipolar disorder, unspecified: Secondary | ICD-10-CM | POA: Insufficient documentation

## 2013-06-17 DIAGNOSIS — R109 Unspecified abdominal pain: Secondary | ICD-10-CM

## 2013-06-17 DIAGNOSIS — F17200 Nicotine dependence, unspecified, uncomplicated: Secondary | ICD-10-CM | POA: Insufficient documentation

## 2013-06-17 DIAGNOSIS — R1031 Right lower quadrant pain: Secondary | ICD-10-CM | POA: Diagnosis not present

## 2013-06-17 DIAGNOSIS — F988 Other specified behavioral and emotional disorders with onset usually occurring in childhood and adolescence: Secondary | ICD-10-CM | POA: Insufficient documentation

## 2013-06-17 DIAGNOSIS — Z79899 Other long term (current) drug therapy: Secondary | ICD-10-CM | POA: Insufficient documentation

## 2013-06-17 DIAGNOSIS — Z880 Allergy status to penicillin: Secondary | ICD-10-CM | POA: Diagnosis not present

## 2013-06-17 DIAGNOSIS — E860 Dehydration: Secondary | ICD-10-CM | POA: Diagnosis not present

## 2013-06-17 DIAGNOSIS — R110 Nausea: Secondary | ICD-10-CM | POA: Diagnosis not present

## 2013-06-17 DIAGNOSIS — R319 Hematuria, unspecified: Secondary | ICD-10-CM | POA: Diagnosis not present

## 2013-06-17 DIAGNOSIS — J45909 Unspecified asthma, uncomplicated: Secondary | ICD-10-CM | POA: Insufficient documentation

## 2013-06-17 LAB — URINALYSIS, ROUTINE W REFLEX MICROSCOPIC
Glucose, UA: NEGATIVE mg/dL
Hgb urine dipstick: NEGATIVE
Leukocytes, UA: NEGATIVE
Nitrite: NEGATIVE
Protein, ur: NEGATIVE mg/dL
Specific Gravity, Urine: >1.03 — ABNORMAL HIGH (ref 1.005–1.030)
Urobilinogen, UA: 1 mg/dL (ref 0.0–1.0)
pH: 5.5 (ref 5.0–8.0)

## 2013-06-17 MED ORDER — OXYCODONE-ACETAMINOPHEN 5-325 MG PO TABS: 1.0000 | ORAL_TABLET | Freq: Four times a day (QID) | ORAL | Status: DC | PRN

## 2013-06-17 NOTE — Discharge Instructions (Signed)
Follow up with dr. fanta next week 

## 2013-06-17 NOTE — ED Notes (Signed)
Was told 2 nights ago he had acute apendicitis.  Was told if he had more problems with pain or anything else to return.  Cont' to have pain, nausea and pain in RLQ and has now developed dark, reddish tinted urine.

## 2013-06-17 NOTE — ED Notes (Signed)
Abdomen soft without guarding

## 2013-06-17 NOTE — ED Provider Notes (Signed)
CSN: [id number]     Arrival date & time 06/17/13  2038 History  This chart was scribed for Gregory Lennert, MD by Gregory Cox, ED Scribe. This patient was seen in room APA12/APA12 and the patient's care was started at 9:12 PM.    Chief Complaint  Patient presents with  . Abdominal Pain  . Hematuria   Patient is a 28 y.o. male presenting with abdominal pain and hematuria. The history is provided by the patient. No language interpreter was used.  Abdominal Pain Pain location:  RLQ Pain radiates to:  R flank Pain severity:  Moderate Onset quality:  Gradual Timing:  Constant Progression:  Unchanged Chronicity:  New Relieved by:  Nothing Worsened by:  Nothing tried Ineffective treatments:  None tried Associated symptoms: hematuria and nausea   Associated symptoms: no chest pain, no cough, no diarrhea and no fatigue   Nausea:    Severity:  Moderate   Onset quality:  Gradual   Timing:  Constant   Progression:  Unchanged Hematuria This is a new problem. The current episode started yesterday. The problem has not changed since onset.Associated symptoms include abdominal pain. Pertinent negatives include no chest pain and no headaches. Nothing aggravates the symptoms. Nothing relieves the symptoms. He has tried nothing for the symptoms.   HPI Comments: Gregory Cox is a 28 y.o. male who presents to the Emergency Department complaining of a constant pain to the RLQ of the abdomen that he states radiates to the right flank onset a few days ago. Patient reports associated nausea and hematuria onset a few days ago. Patient was seen here 2 days ago for similar complaints and received blood labs, UA, and a CT of the abdomen that all were normal. Patient was advised to return to the ED if he had any new or worsening symptoms. Patient has a history of asthma, ADD, and bipolar 1 disorder.   Past Medical History  Diagnosis Date  . Asthma   . Attention deficit disorder (ADD)   . Bipolar 1 disorder     Past Surgical History  Procedure Laterality Date  . Wisdom tooth extraction     History reviewed. No pertinent family history. History  Substance Use Topics  . Smoking status: Current Every Day Smoker -- 0.50 packs/day    Types: Cigarettes  . Smokeless tobacco: Not on file  . Alcohol Use: Yes     Comment: ocassional    Review of Systems  Constitutional: Negative for appetite change and fatigue.  HENT: Negative for congestion, ear discharge and sinus pressure.   Eyes: Negative for discharge.  Respiratory: Negative for cough.   Cardiovascular: Negative for chest pain.  Gastrointestinal: Positive for nausea and abdominal pain. Negative for diarrhea.  Genitourinary: Positive for hematuria. Negative for frequency.  Musculoskeletal: Negative for back pain.  Skin: Negative for rash.  Neurological: Negative for seizures and headaches.  Psychiatric/Behavioral: Negative for hallucinations.      Allergies  Fish allergy and Penicillins  Home Medications   Current Outpatient Rx  Name  Route  Sig  Dispense  Refill  . ARIPiprazole (ABILIFY) 10 MG tablet   Oral   Take 10 mg by mouth 2 (two) times daily.         Marland Kitchen atomoxetine (STRATTERA) 10 MG capsule   Oral   Take 10 mg by mouth daily.         Marland Kitchen HYDROcodone-acetaminophen (NORCO/VICODIN) 5-325 MG per tablet   Oral   Take 2 tablets by mouth every  4 (four) hours as needed.   10 tablet   0   . ondansetron (ZOFRAN) 4 MG tablet   Oral   Take 1 tablet (4 mg total) by mouth every 6 (six) hours.   12 tablet   0   . traZODone (DESYREL) 100 MG tablet   Oral   Take 100 mg by mouth at bedtime.          Triage Vitals: BP 126/65  Pulse 59  Temp(Src) 98.2 F (36.8 C) (Oral)  Resp 16  Ht 6\' 2"  (1.88 m)  Wt 240 lb 12.8 oz (109.226 kg)  BMI 30.90 kg/m2  SpO2 100%  Physical Exam  Constitutional: He is oriented to person, place, and time. He appears well-developed.  HENT:  Head: Normocephalic.  Eyes: Conjunctivae and  EOM are normal. No scleral icterus.  Neck: Neck supple. No thyromegaly present.  Cardiovascular: Normal rate and regular rhythm.  Exam reveals no gallop and no friction rub.   No murmur heard. Pulmonary/Chest: No stridor. He has no wheezes. He has no rales. He exhibits no tenderness.  Abdominal: He exhibits no distension. There is tenderness. There is no rebound.  Moderate right flank and RLQ tenderness.   Musculoskeletal: Normal range of motion. He exhibits no edema.  Lymphadenopathy:    He has no cervical adenopathy.  Neurological: He is oriented to person, place, and time. He exhibits normal muscle tone. Coordination normal.  Skin: No rash noted. No erythema.  Psychiatric: He has a normal mood and affect. His behavior is normal.    ED Course  Procedures (including critical care time)  DIAGNOSTIC STUDIES: Oxygen Saturation is 100% on room air, normal by my interpretation.    COORDINATION OF CARE: 9:16 PM- Ordered UA. Discussed treatment plan with patient at bedside and patient verbalized agreement.   10:10 PM- Discussed that UA results were relatively normal. Will discharge patient with a short course of Percocet to manage symptoms. Discussed treatment plan with patient at bedside and patient verbalized agreement.    Labs Review Labs Reviewed  URINALYSIS, ROUTINE W REFLEX MICROSCOPIC - Abnormal; Notable for the following:    Color, Urine AMBER (*)    Specific Gravity, Urine >1.030 (*)    Bilirubin Urine SMALL (*)    Ketones, ur TRACE (*)    All other components within normal limits   Imaging Review No results found.   EKG Interpretation None      MDM   Pt had nl ct 4 days ago,  Pt mildly dehydrated on u/a,  Pt to follow up with pcp and possibly gi referral next week.  Gregory LennertJoseph L Chelsea Nusz, MD 06/17/13 2215

## 2013-07-17 ENCOUNTER — Emergency Department (HOSPITAL_COMMUNITY): Payer: Medicare Other

## 2013-07-17 ENCOUNTER — Encounter (HOSPITAL_COMMUNITY): Payer: Self-pay | Admitting: Emergency Medicine

## 2013-07-17 ENCOUNTER — Emergency Department (HOSPITAL_COMMUNITY)
Admission: EM | Admit: 2013-07-17 | Discharge: 2013-07-17 | Disposition: A | Discharge status: Home / Self Care | Payer: Medicare Other | Attending: Emergency Medicine | Admitting: Emergency Medicine

## 2013-07-17 DIAGNOSIS — Z880 Allergy status to penicillin: Secondary | ICD-10-CM | POA: Insufficient documentation

## 2013-07-17 DIAGNOSIS — Z87448 Personal history of other diseases of urinary system: Secondary | ICD-10-CM | POA: Diagnosis not present

## 2013-07-17 DIAGNOSIS — S81809A Unspecified open wound, unspecified lower leg, initial encounter: Secondary | ICD-10-CM | POA: Diagnosis not present

## 2013-07-17 DIAGNOSIS — Y9389 Activity, other specified: Secondary | ICD-10-CM | POA: Insufficient documentation

## 2013-07-17 DIAGNOSIS — IMO0002 Reserved for concepts with insufficient information to code with codable children: Secondary | ICD-10-CM | POA: Diagnosis not present

## 2013-07-17 DIAGNOSIS — F17200 Nicotine dependence, unspecified, uncomplicated: Secondary | ICD-10-CM | POA: Diagnosis not present

## 2013-07-17 DIAGNOSIS — G4700 Insomnia, unspecified: Secondary | ICD-10-CM | POA: Diagnosis not present

## 2013-07-17 DIAGNOSIS — S91009A Unspecified open wound, unspecified ankle, initial encounter: Principal | ICD-10-CM

## 2013-07-17 DIAGNOSIS — Z79899 Other long term (current) drug therapy: Secondary | ICD-10-CM | POA: Insufficient documentation

## 2013-07-17 DIAGNOSIS — T148XXA Other injury of unspecified body region, initial encounter: Secondary | ICD-10-CM | POA: Diagnosis not present

## 2013-07-17 DIAGNOSIS — S81009A Unspecified open wound, unspecified knee, initial encounter: Secondary | ICD-10-CM | POA: Diagnosis not present

## 2013-07-17 DIAGNOSIS — F319 Bipolar disorder, unspecified: Secondary | ICD-10-CM | POA: Insufficient documentation

## 2013-07-17 DIAGNOSIS — J45909 Unspecified asthma, uncomplicated: Secondary | ICD-10-CM | POA: Diagnosis not present

## 2013-07-17 DIAGNOSIS — Y92410 Unspecified street and highway as the place of occurrence of the external cause: Secondary | ICD-10-CM | POA: Insufficient documentation

## 2013-07-17 DIAGNOSIS — S81019A Laceration without foreign body, unspecified knee, initial encounter: Secondary | ICD-10-CM

## 2013-07-17 DIAGNOSIS — M25569 Pain in unspecified knee: Secondary | ICD-10-CM | POA: Diagnosis not present

## 2013-07-17 HISTORY — DX: Insomnia, unspecified: G47.00

## 2013-07-17 HISTORY — DX: Acute kidney failure, unspecified: N17.9

## 2013-07-17 MED ORDER — POVIDONE-IODINE 10 % EX SOLN
CUTANEOUS | Status: AC
  Administered 2013-07-17: 13:00:00
  Filled 2013-07-17: qty 118

## 2013-07-17 MED ORDER — METHOCARBAMOL 500 MG PO TABS: 500.0000 mg | ORAL_TABLET | Freq: Three times a day (TID) | ORAL | Status: DC

## 2013-07-17 MED ORDER — LIDOCAINE HCL (PF) 2 % IJ SOLN
INTRAMUSCULAR | Status: AC
  Administered 2013-07-17: 13:00:00
  Filled 2013-07-17: qty 10

## 2013-07-17 MED ORDER — LIDOCAINE HCL (PF) 2 % IJ SOLN
INTRAMUSCULAR | Status: AC
  Administered 2013-07-17: 10 mL
  Filled 2013-07-17: qty 10

## 2013-07-17 MED ORDER — OXYCODONE-ACETAMINOPHEN 5-325 MG PO TABS: 1.0000 | ORAL_TABLET | Freq: Four times a day (QID) | ORAL | Status: DC | PRN

## 2013-07-17 NOTE — ED Notes (Signed)
Loney LaurenceH Bryant in to staple wound.

## 2013-07-17 NOTE — ED Provider Notes (Signed)
Medical screening examination/treatment/procedure(s) were conducted as a shared visit with non-physician practitioner(s) and myself.  I personally evaluated the patient during the encounter.   EKG Interpretation None        Zeanna Sunde B. Bernette MayersSheldon, MD 07/17/13 1319
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
## 2013-07-17 NOTE — ED Notes (Signed)
Pt resting quietly with family at bedside.  Lac to rt knee,
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
## 2013-07-17 NOTE — ED Notes (Signed)
Alert, oriented at time of d/c  Released via W/c

## 2013-07-26 ENCOUNTER — Emergency Department (HOSPITAL_COMMUNITY)
Admission: EM | Admit: 2013-07-26 | Discharge: 2013-07-26 | Disposition: A | Discharge status: Home / Self Care | Payer: Medicare Other | Attending: Emergency Medicine | Admitting: Emergency Medicine

## 2013-07-26 ENCOUNTER — Encounter (HOSPITAL_COMMUNITY): Payer: Self-pay | Admitting: Emergency Medicine

## 2013-07-26 DIAGNOSIS — Z880 Allergy status to penicillin: Secondary | ICD-10-CM | POA: Insufficient documentation

## 2013-07-26 DIAGNOSIS — F17200 Nicotine dependence, unspecified, uncomplicated: Secondary | ICD-10-CM | POA: Insufficient documentation

## 2013-07-26 DIAGNOSIS — F319 Bipolar disorder, unspecified: Secondary | ICD-10-CM | POA: Insufficient documentation

## 2013-07-26 DIAGNOSIS — Z87448 Personal history of other diseases of urinary system: Secondary | ICD-10-CM | POA: Diagnosis not present

## 2013-07-26 DIAGNOSIS — J45909 Unspecified asthma, uncomplicated: Secondary | ICD-10-CM | POA: Diagnosis not present

## 2013-07-26 DIAGNOSIS — Z79899 Other long term (current) drug therapy: Secondary | ICD-10-CM | POA: Diagnosis not present

## 2013-07-26 DIAGNOSIS — Z4802 Encounter for removal of sutures: Secondary | ICD-10-CM | POA: Insufficient documentation

## 2013-07-26 MED ORDER — BACITRACIN-NEOMYCIN-POLYMYXIN 400-5-5000 EX OINT
TOPICAL_OINTMENT | Freq: Once | CUTANEOUS | Status: AC
  Administered 2013-07-26: 1 via TOPICAL
  Filled 2013-07-26: qty 1

## 2013-07-26 NOTE — ED Notes (Signed)
Pt here for staple removal from his R knee.

## 2013-07-26 NOTE — ED Provider Notes (Signed)
Medical screening examination/treatment/procedure(s) were performed by non-physician practitioner and as supervising physician I was immediately available for consultation/collaboration.     Geoffery Lyonsouglas Antjuan Rothe, MD 07/26/13 [phone]
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
## 2013-07-26 NOTE — Discharge Instructions (Signed)
  Staple Removal Care After The staples used to close your skin have been removed. The wound needs continued care so it can heal completely and without problems. The care described here will need to be done for another 5-10 days unless your caregiver advises otherwise.  HOME CARE INSTRUCTIONS   Keep wound site dry and clean.  If skin adhesive strips were applied after the staples were removed, they will begin to peel off in a few days. If they remain after fourteen days, they may be peeled off and discarded.  If you still have a dressing, change it at least once a day or as instructed by your caregiver. If the bandage sticks, soak it off with warm water. Pat dry with a clean towel. Look for signs of infection (see below).  Reapply cream or ointment according to your caregiver's instruction. This will help prevent infection and keep the bandage from sticking. Use of a non-stick material over the wound and under the dressing or wrap will also help keep the bandage from sticking.  If the bandage becomes wet, dirty or develops a foul smell, change it as soon as possible.  New scars become sunburned easily. Use sunscreens with protection factor (SPF) of at least 15 when out in the sun.  Only take over-the-counter or prescription medicines for pain, discomfort or fever as directed by your caregiver. SEEK IMMEDIATE MEDICAL CARE IF:   There is redness, swelling or increasing pain in the wound.  Pus is coming from the wound.  An unexplained oral temperature above 102 F (38.9 C) develops.  You notice a foul smell coming from the wound or dressing.  There is a breaking open of the suture line (edges not staying together) of the wound edges after staples have been removed. Document Released: 03/19/2008 Document Revised: 06/29/2011 Document Reviewed: 03/19/2008 Exit[employer] Patient Information 2014 [employer].  

## 2013-08-22 ENCOUNTER — Encounter (HOSPITAL_COMMUNITY): Payer: Self-pay | Admitting: Emergency Medicine

## 2013-08-22 ENCOUNTER — Emergency Department (HOSPITAL_COMMUNITY)
Admission: EM | Admit: 2013-08-22 | Discharge: 2013-08-22 | Disposition: A | Discharge status: Home / Self Care | Payer: Medicare Other | Attending: Emergency Medicine | Admitting: Emergency Medicine

## 2013-08-22 DIAGNOSIS — Z880 Allergy status to penicillin: Secondary | ICD-10-CM | POA: Diagnosis not present

## 2013-08-22 DIAGNOSIS — J029 Acute pharyngitis, unspecified: Secondary | ICD-10-CM | POA: Insufficient documentation

## 2013-08-22 DIAGNOSIS — F319 Bipolar disorder, unspecified: Secondary | ICD-10-CM | POA: Insufficient documentation

## 2013-08-22 DIAGNOSIS — Z87448 Personal history of other diseases of urinary system: Secondary | ICD-10-CM | POA: Diagnosis not present

## 2013-08-22 DIAGNOSIS — J45909 Unspecified asthma, uncomplicated: Secondary | ICD-10-CM | POA: Diagnosis not present

## 2013-08-22 DIAGNOSIS — F17200 Nicotine dependence, unspecified, uncomplicated: Secondary | ICD-10-CM | POA: Diagnosis not present

## 2013-08-22 DIAGNOSIS — M542 Cervicalgia: Secondary | ICD-10-CM | POA: Insufficient documentation

## 2013-08-22 DIAGNOSIS — R110 Nausea: Secondary | ICD-10-CM | POA: Diagnosis not present

## 2013-08-22 MED ORDER — IBUPROFEN 800 MG PO TABS: 800.0000 mg | ORAL_TABLET | Freq: Three times a day (TID) | ORAL | Status: DC

## 2013-08-22 MED ORDER — IBUPROFEN 800 MG PO TABS
800.0000 mg | ORAL_TABLET | Freq: Once | ORAL | Status: AC
  Administered 2013-08-22: 800 mg via ORAL
  Filled 2013-08-22: qty 1

## 2013-08-22 MED ORDER — HYDROCODONE-ACETAMINOPHEN 7.5-325 MG/15ML PO SOLN
10.0000 mL | Freq: Once | ORAL | Status: AC
  Administered 2013-08-22: 10 mL via ORAL
  Filled 2013-08-22: qty 15

## 2013-08-22 MED ORDER — HYDROCODONE-ACETAMINOPHEN 7.5-325 MG/15ML PO SOLN: 10.0000 mL | Freq: Four times a day (QID) | ORAL | Status: DC | PRN

## 2013-08-22 NOTE — Discharge Instructions (Signed)
Pharyngitis °Pharyngitis is a sore throat (pharynx). There is redness, pain, and swelling of your throat. °HOME CARE  °· Drink enough fluids to keep your pee (urine) clear or pale yellow. °· Only take medicine as told by your doctor. °· You may get sick again if you do not take medicine as told. Finish your medicines, even if you start to feel better. °· Do not take aspirin. °· Rest. °· Rinse your mouth (gargle) with salt water (½ tsp of salt per 1 qt of water) every 1 2 hours. This will help the pain. °· If you are not at risk for choking, you can suck on hard candy or sore throat lozenges. °GET HELP IF: °· You have large, tender lumps on your neck. °· You have a rash. °· You cough up green, yellow-brown, or bloody spit. °GET HELP RIGHT AWAY IF:  °· You have a stiff neck. °· You drool or cannot swallow liquids. °· You throw up (vomit) or are not able to keep medicine or liquids down. °· You have very bad pain that does not go away with medicine. °· You have problems breathing (not from a stuffy nose). °MAKE SURE YOU:  °· Understand these instructions. °· Will watch your condition. °· Will get help right away if you are not doing well or get worse. °Document Released: 09/23/2007 Document Revised: 01/25/2013 Document Reviewed: 12/12/2012 °ExitCare® Patient Information ©2014 [employer]. ° °

## 2013-08-22 NOTE — ED Notes (Addendum)
Pt reports "knot" in throat x2 days. Airway patent. Pt reports pain with swallowing and left side neck tenderness. Pt reports last known fever was last night. Pt reports nausea that started last night. Pt denies nausea at this time.

## 2013-08-24 NOTE — ED Provider Notes (Signed)
Medical screening examination/treatment/procedure(s) were performed by non-physician practitioner and as supervising physician I was immediately available for consultation/collaboration.   EKG Interpretation None        Joya Gaskinsonald W Mackinsey Pelland, MD 08/24/13 1429

## 2013-08-24 NOTE — ED Provider Notes (Signed)
CSN: [id number]     Arrival date & time 08/22/13  1809 History   First MD Initiated Contact with Patient 08/22/13 1848     Chief Complaint  Patient presents with  . Sore Throat     (Consider location/radiation/quality/duration/timing/severity/associated sxs/prior Treatment) Patient is a 28 y.o. male presenting with pharyngitis. The history is provided by the patient.  Sore Throat This is a new problem. Episode onset: 2 days. The problem occurs constantly. The problem has been unchanged. Associated symptoms include congestion, nausea, neck pain and a sore throat. Pertinent negatives include no abdominal pain, arthralgias, chills, coughing, fever, headaches, joint swelling, myalgias, numbness, rash, swollen glands, visual change, vomiting or weakness. The symptoms are aggravated by swallowing. He has tried nothing for the symptoms. The treatment provided no relief.   Patient reports pain to his throat and left side of his tongue for 2 days.  He states the symptoms began after preforming oral sex.  He states that he has also had some nausea but able to eat and drink some fluids.  He has not tried any medications for his symptoms.  He states that he was "feverish" last evening but did not take his temperature.  He denies inability to swallow, vomiting, cough or swelling of his tongue or throat.     Past Medical History  Diagnosis Date  . Asthma   . Attention deficit disorder (ADD)   . Bipolar 1 disorder   . Acute kidney failure   . Insomnia    Past Surgical History  Procedure Laterality Date  . Wisdom tooth extraction     Family History  Problem Relation Age of Onset  . Diabetes Mother   . Hypertension Mother   . Diabetes Other   . Hypertension Other   . Kidney disease Other   . Kidney disease Father    History  Substance Use Topics  . Smoking status: Current Every Day Smoker -- 0.50 packs/day for 7 years    Types: Cigarettes  . Smokeless tobacco: Never Used  . Alcohol Use: Yes       Comment: occasional    Review of Systems  Constitutional: Negative for fever, chills, activity change and appetite change.  HENT: Positive for congestion and sore throat. Negative for ear pain, facial swelling, trouble swallowing and voice change.   Eyes: Negative for pain and visual disturbance.  Respiratory: Negative for cough and shortness of breath.   Gastrointestinal: Positive for nausea. Negative for vomiting and abdominal pain.  Musculoskeletal: Positive for neck pain. Negative for arthralgias, joint swelling, myalgias and neck stiffness.  Skin: Negative for color change and rash.  Neurological: Negative for dizziness, facial asymmetry, speech difficulty, weakness, numbness and headaches.  Hematological: Negative for adenopathy.  All other systems reviewed and are negative.     Allergies  Fish allergy and Penicillins  Home Medications   Prior to Admission medications   Medication Sig Start Date End Date Taking? Authorizing Provider  ARIPiprazole (ABILIFY) 20 MG tablet Take 20 mg by mouth daily.   Yes Historical Provider, MD  traZODone (DESYREL) 100 MG tablet Take 100 mg by mouth at bedtime.   Yes Historical Provider, MD  HYDROcodone-acetaminophen (HYCET) 7.5-325 mg/15 ml solution Take 10 mLs by mouth every 6 (six) hours as needed for moderate pain. 08/22/13   Rayyan Burley L. Areli Frary, PA-C  ibuprofen (ADVIL,MOTRIN) 800 MG tablet Take 1 tablet (800 mg total) by mouth 3 (three) times daily. 08/22/13   Jonn Chaikin L. Markey Deady, PA-C   BP 112/63  Pulse 79  Temp(Src) 97.9 F (36.6 C) (Oral)  Resp 18  Ht 6\' 2"  (1.88 m)  Wt 250 lb (113.399 kg)  BMI 32.08 kg/m2  SpO2 99% Physical Exam  Nursing note and vitals reviewed. Constitutional: He is oriented to person, place, and time. He appears well-developed and well-nourished. No distress.  HENT:  Head: Normocephalic and atraumatic.  Right Ear: Tympanic membrane and ear canal normal.  Left Ear: Tympanic membrane and ear canal normal.   Mouth/Throat: Uvula is midline, oropharynx is clear and moist and mucous membranes are normal. No trismus in the jaw. No uvula swelling. No oropharyngeal exudate, posterior oropharyngeal edema, posterior oropharyngeal erythema or tonsillar abscesses.  Airway patent , no edema, erythema, exudates or PTA.  No sublingual abnml.  Pain reproduced when pt elevates his tongue.  Neck: Normal range of motion, full passive range of motion without pain and phonation normal. Neck supple. Muscular tenderness present. No spinous process tenderness present. No rigidity. No edema, no erythema and normal range of motion present. No Brudzinski's sign and no Kernig's sign noted.    Mild ttp along the muscles of the left neck.  No edema, no lymphadenopathy, no nuchal rigidity  Cardiovascular: Normal rate, regular rhythm and normal heart sounds.   Pulmonary/Chest: Effort normal and breath sounds normal. No respiratory distress.  Abdominal: There is no splenomegaly. There is no tenderness.  Musculoskeletal: Normal range of motion.  Lymphadenopathy:    He has no cervical adenopathy.  Neurological: He is alert and oriented to person, place, and time. He exhibits normal muscle tone. Coordination normal.  Skin: Skin is warm and dry.    ED Course  Procedures (including critical care time) Labs Review Labs Reviewed - No data to display  Imaging Review No results found.   EKG Interpretation None      MDM   Final diagnoses:  Pharyngitis   Airway patent,  Pt is well appearing.  No concerning sx's for strep, PTA or infectious process of the deep structures to the floor of the mouth or neck.  Pt agrees to symptomatic tx and to return for any worsening symptoms.  VSS.  He appears stable for d/c    Obediah Welles L. Lindley Stachnik, PA-C 08/24/13 1249

## 2013-09-05 ENCOUNTER — Encounter (HOSPITAL_COMMUNITY): Payer: Self-pay | Admitting: Emergency Medicine

## 2013-09-05 ENCOUNTER — Emergency Department (HOSPITAL_COMMUNITY)
Admission: EM | Admit: 2013-09-05 | Discharge: 2013-09-05 | Disposition: A | Discharge status: Home / Self Care | Payer: Medicare Other | Attending: Emergency Medicine | Admitting: Emergency Medicine

## 2013-09-05 ENCOUNTER — Emergency Department (HOSPITAL_COMMUNITY): Payer: Medicare Other

## 2013-09-05 DIAGNOSIS — F319 Bipolar disorder, unspecified: Secondary | ICD-10-CM | POA: Insufficient documentation

## 2013-09-05 DIAGNOSIS — Z87448 Personal history of other diseases of urinary system: Secondary | ICD-10-CM | POA: Insufficient documentation

## 2013-09-05 DIAGNOSIS — Z79899 Other long term (current) drug therapy: Secondary | ICD-10-CM | POA: Diagnosis not present

## 2013-09-05 DIAGNOSIS — J45909 Unspecified asthma, uncomplicated: Secondary | ICD-10-CM | POA: Insufficient documentation

## 2013-09-05 DIAGNOSIS — J029 Acute pharyngitis, unspecified: Secondary | ICD-10-CM

## 2013-09-05 DIAGNOSIS — Z880 Allergy status to penicillin: Secondary | ICD-10-CM | POA: Diagnosis not present

## 2013-09-05 DIAGNOSIS — Z791 Long term (current) use of non-steroidal anti-inflammatories (NSAID): Secondary | ICD-10-CM | POA: Insufficient documentation

## 2013-09-05 DIAGNOSIS — F17200 Nicotine dependence, unspecified, uncomplicated: Secondary | ICD-10-CM | POA: Insufficient documentation

## 2013-09-05 MED ORDER — DEXAMETHASONE SODIUM PHOSPHATE 4 MG/ML IJ SOLN
10.0000 mg | Freq: Once | INTRAMUSCULAR | Status: AC
  Administered 2013-09-05: 10 mg via INTRAMUSCULAR
  Filled 2013-09-05: qty 3

## 2013-09-05 MED ORDER — MAGIC MOUTHWASH
ORAL | Status: AC
  Filled 2013-09-05: qty 5

## 2013-09-05 MED ORDER — HYDROMORPHONE HCL PF 1 MG/ML IJ SOLN
1.0000 mg | Freq: Once | INTRAMUSCULAR | Status: AC
  Administered 2013-09-05: 1 mg via INTRAMUSCULAR
  Filled 2013-09-05: qty 1

## 2013-09-05 MED ORDER — MAGIC MOUTHWASH W/LIDOCAINE
10.0000 mL | Freq: Once | ORAL | Status: AC
  Administered 2013-09-05: 10 mL via ORAL
  Filled 2013-09-05: qty 10

## 2013-09-05 MED ORDER — MAGIC MOUTHWASH W/LIDOCAINE: 5.0000 mL | Freq: Three times a day (TID) | ORAL | Status: DC | PRN

## 2013-09-05 MED ORDER — HYDROCODONE-ACETAMINOPHEN 7.5-325 MG/15ML PO SOLN: 10.0000 mL | Freq: Four times a day (QID) | ORAL | Status: DC | PRN

## 2013-09-05 NOTE — ED Notes (Signed)
Patient c/o sore throat and states not any better in 2 weeks.

## 2013-09-05 NOTE — ED Provider Notes (Signed)
CSN: [id number]     Arrival date & time 09/05/13  0222 History   First MD Initiated Contact with Patient 09/05/13 0225     Chief Complaint  Patient presents with  . Sore Throat     (Consider location/radiation/quality/duration/timing/severity/associated sxs/prior Treatment) HPI History per patient. Sore throat worse over the last 3 days. Similar symptoms about 2 weeks ago and he was evaluated here. Hurts to swallow. His everyday smoker. No cough or sputum production. No congestion or postnasal drip. Pain is sharp and moderate in severity. No unilateral symptoms. No facial swelling. Had been taking prescription medications that helped. No known sick contacts. Past Medical History  Diagnosis Date  . Asthma   . Attention deficit disorder (ADD)   . Bipolar 1 disorder   . Acute kidney failure   . Insomnia    Past Surgical History  Procedure Laterality Date  . Wisdom tooth extraction     Family History  Problem Relation Age of Onset  . Diabetes Mother   . Hypertension Mother   . Diabetes Other   . Hypertension Other   . Kidney disease Other   . Kidney disease Father    History  Substance Use Topics  . Smoking status: Current Every Day Smoker -- 0.50 packs/day for 7 years    Types: Cigarettes  . Smokeless tobacco: Never Used  . Alcohol Use: Yes     Comment: occasional    Review of Systems  Constitutional: Negative for fever and chills.  HENT: Positive for postnasal drip and sore throat. Negative for trouble swallowing and voice change.   Eyes: Negative for pain.  Respiratory: Negative for shortness of breath.   Cardiovascular: Negative for chest pain.  Gastrointestinal: Negative for abdominal pain.  Genitourinary: Negative for dysuria.  Musculoskeletal: Negative for back pain, neck pain and neck stiffness.  Skin: Negative for rash.  Neurological: Negative for headaches.  All other systems reviewed and are negative.     Allergies  Fish allergy and  Penicillins  Home Medications   Prior to Admission medications   Medication Sig Start Date End Date Taking? Authorizing Provider  ARIPiprazole (ABILIFY) 20 MG tablet Take 20 mg by mouth daily.   Yes Historical Provider, MD  HYDROcodone-acetaminophen (HYCET) 7.5-325 mg/15 ml solution Take 10 mLs by mouth every 6 (six) hours as needed for moderate pain. 08/22/13  Yes Tammy L. Triplett, PA-C  ibuprofen (ADVIL,MOTRIN) 800 MG tablet Take 1 tablet (800 mg total) by mouth 3 (three) times daily. 08/22/13  Yes Tammy L. Triplett, PA-C  traZODone (DESYREL) 100 MG tablet Take 100 mg by mouth at bedtime.   Yes Historical Provider, MD   BP 119/72  Pulse 73  Temp(Src) 98.3 F (36.8 C) (Oral)  Resp 20  Wt 250 lb (113.399 kg)  SpO2 100% Physical Exam  Constitutional: He is oriented to person, place, and time. He appears well-developed and well-nourished.  HENT:  Head: Normocephalic and atraumatic.  Mouth/Throat: Oropharynx is clear and moist. No oropharyngeal exudate.  Uvula midline. No tonsillar swelling or exudates. No trismus.  Eyes: EOM are normal. Pupils are equal, round, and reactive to light.  Neck: Normal range of motion. Neck supple. No tracheal deviation present.  Cardiovascular: Normal rate, regular rhythm and intact distal pulses.   Pulmonary/Chest: Effort normal and breath sounds normal. No stridor. No respiratory distress.  Abdominal: Soft. There is no tenderness.  Musculoskeletal: Normal range of motion. He exhibits no edema.  Lymphadenopathy:    He has no cervical adenopathy.  Neurological: He is alert and oriented to person, place, and time.  Skin: Skin is warm and dry.    ED Course  Procedures (including critical care time) Labs Review Labs Reviewed - No data to display  Imaging Review Dg Neck Soft Tissue  09/05/2013   CLINICAL DATA:  Sore throat.  EXAM: NECK SOFT TISSUES - 1+ VIEW  COMPARISON:  No priors.  FINDINGS: Hypopharynx, epiglottis and subglottic airway are  unremarkable in appearance. Prevertebral soft tissues are normal.  IMPRESSION: 1. No acute findings.   Electronically Signed   By: Trudie Reedaniel  Entrikin M.D.   On: 09/05/2013 03:19    IM Dilaudid. IM Decadron  Plan discharge home with instructions and precautions for pharyngitis. Plan followup primary care physician.  MDM   Diagnosis: Pharyngitis  Viral versus bacterial. No clinical strep pharyngitis. Evaluated with x-ray reviewed as above. Medications provided. Vital signs and nursing notes reviewed and considered.    Sunnie NielsenBrian Serenna Deroy, MD 09/05/13 [phone]

## 2013-09-05 NOTE — Discharge Instructions (Signed)
Sore Throat A sore throat is pain, burning, irritation, or scratchiness of the throat. There is often pain or tenderness when swallowing or talking. A sore throat may be accompanied by other symptoms, such as coughing, sneezing, fever, and swollen neck glands. A sore throat is often the first sign of another sickness, such as a cold, flu, strep throat, or mononucleosis (commonly known as mono). Most sore throats go away without medical treatment. CAUSES  The most common causes of a sore throat include:  A viral infection, such as a cold, flu, or mono.  A bacterial infection, such as strep throat, tonsillitis, or whooping cough.  Seasonal allergies.  Dryness in the air.  Irritants, such as smoke or pollution.  Gastroesophageal reflux disease (GERD). HOME CARE INSTRUCTIONS   Only take over-the-counter medicines as directed by your caregiver.  Drink enough fluids to keep your urine clear or pale yellow.  Rest as needed.  Try using throat sprays, lozenges, or sucking on hard candy to ease any pain (if older than 4 years or as directed).  Sip warm liquids, such as broth, herbal tea, or warm water with honey to relieve pain temporarily. You may also eat or drink cold or frozen liquids such as frozen ice pops.  Gargle with salt water (mix 1 tsp salt with 8 oz of water).  Do not smoke and avoid secondhand smoke.  Put a cool-mist humidifier in your bedroom at night to moisten the air. You can also turn on a hot shower and sit in the bathroom with the door closed for 5 10 minutes. SEEK IMMEDIATE MEDICAL CARE IF:  You have difficulty breathing.  You are unable to swallow fluids, soft foods, or your saliva.  You have increased swelling in the throat.  Your sore throat does not get better in 7 days.  You have nausea and vomiting.  You have a fever or persistent symptoms for more than 2 3 days.  You have a fever and your symptoms suddenly get worse. MAKE SURE YOU:   Understand  these instructions.  Will watch your condition.  Will get help right away if you are not doing well or get worse. Document Released: 05/14/2004 Document Revised: 03/23/2012 Document Reviewed: 12/13/2011 ExitCare Patient Information 2014 [employer].  

## 2013-12-28 ENCOUNTER — Encounter (HOSPITAL_COMMUNITY): Payer: Self-pay | Admitting: Emergency Medicine

## 2013-12-28 DIAGNOSIS — W208XXA Other cause of strike by thrown, projected or falling object, initial encounter: Secondary | ICD-10-CM | POA: Insufficient documentation

## 2013-12-28 DIAGNOSIS — IMO0002 Reserved for concepts with insufficient information to code with codable children: Secondary | ICD-10-CM | POA: Diagnosis not present

## 2013-12-28 DIAGNOSIS — Z79899 Other long term (current) drug therapy: Secondary | ICD-10-CM | POA: Insufficient documentation

## 2013-12-28 DIAGNOSIS — F17200 Nicotine dependence, unspecified, uncomplicated: Secondary | ICD-10-CM | POA: Diagnosis not present

## 2013-12-28 DIAGNOSIS — S46909A Unspecified injury of unspecified muscle, fascia and tendon at shoulder and upper arm level, unspecified arm, initial encounter: Secondary | ICD-10-CM | POA: Insufficient documentation

## 2013-12-28 DIAGNOSIS — W268XXA Contact with other sharp object(s), not elsewhere classified, initial encounter: Secondary | ICD-10-CM | POA: Insufficient documentation

## 2013-12-28 DIAGNOSIS — F319 Bipolar disorder, unspecified: Secondary | ICD-10-CM | POA: Diagnosis not present

## 2013-12-28 DIAGNOSIS — M25519 Pain in unspecified shoulder: Secondary | ICD-10-CM | POA: Diagnosis not present

## 2013-12-28 DIAGNOSIS — S4980XA Other specified injuries of shoulder and upper arm, unspecified arm, initial encounter: Secondary | ICD-10-CM | POA: Diagnosis not present

## 2013-12-28 DIAGNOSIS — Z87448 Personal history of other diseases of urinary system: Secondary | ICD-10-CM | POA: Insufficient documentation

## 2013-12-28 DIAGNOSIS — Z760 Encounter for issue of repeat prescription: Secondary | ICD-10-CM | POA: Diagnosis not present

## 2013-12-28 DIAGNOSIS — Y9389 Activity, other specified: Secondary | ICD-10-CM | POA: Diagnosis not present

## 2013-12-28 DIAGNOSIS — Z880 Allergy status to penicillin: Secondary | ICD-10-CM | POA: Diagnosis not present

## 2013-12-28 DIAGNOSIS — Y929 Unspecified place or not applicable: Secondary | ICD-10-CM | POA: Insufficient documentation

## 2013-12-28 DIAGNOSIS — S51809A Unspecified open wound of unspecified forearm, initial encounter: Secondary | ICD-10-CM | POA: Diagnosis not present

## 2013-12-28 DIAGNOSIS — S40019A Contusion of unspecified shoulder, initial encounter: Secondary | ICD-10-CM | POA: Insufficient documentation

## 2013-12-28 DIAGNOSIS — J45901 Unspecified asthma with (acute) exacerbation: Secondary | ICD-10-CM | POA: Diagnosis not present

## 2013-12-28 DIAGNOSIS — Z23 Encounter for immunization: Secondary | ICD-10-CM | POA: Diagnosis not present

## 2013-12-28 DIAGNOSIS — I10 Essential (primary) hypertension: Secondary | ICD-10-CM | POA: Diagnosis not present

## 2013-12-28 DIAGNOSIS — M25529 Pain in unspecified elbow: Secondary | ICD-10-CM | POA: Diagnosis not present

## 2013-12-28 NOTE — ED Notes (Addendum)
Pain lt arm , piece of beam with nail fell on lt shoulder and nail went into lt forearm. No active bleeding , numbness in lt thumb.  Good lt radial pulse  . Injury is bandaged no active bleeding

## 2013-12-29 ENCOUNTER — Emergency Department (HOSPITAL_COMMUNITY)
Admission: EM | Admit: 2013-12-29 | Discharge: 2013-12-29 | Disposition: A | Discharge status: Home / Self Care | Payer: Medicare Other | Attending: Emergency Medicine | Admitting: Emergency Medicine

## 2013-12-29 ENCOUNTER — Emergency Department (HOSPITAL_COMMUNITY): Payer: Medicare Other

## 2013-12-29 DIAGNOSIS — T148XXA Other injury of unspecified body region, initial encounter: Secondary | ICD-10-CM

## 2013-12-29 DIAGNOSIS — S40022A Contusion of left upper arm, initial encounter: Secondary | ICD-10-CM

## 2013-12-29 DIAGNOSIS — S40019A Contusion of unspecified shoulder, initial encounter: Secondary | ICD-10-CM | POA: Diagnosis not present

## 2013-12-29 DIAGNOSIS — M25519 Pain in unspecified shoulder: Secondary | ICD-10-CM | POA: Diagnosis not present

## 2013-12-29 DIAGNOSIS — S46909A Unspecified injury of unspecified muscle, fascia and tendon at shoulder and upper arm level, unspecified arm, initial encounter: Secondary | ICD-10-CM | POA: Diagnosis not present

## 2013-12-29 DIAGNOSIS — S0081XA Abrasion of other part of head, initial encounter: Secondary | ICD-10-CM

## 2013-12-29 DIAGNOSIS — Z760 Encounter for issue of repeat prescription: Secondary | ICD-10-CM

## 2013-12-29 DIAGNOSIS — M25529 Pain in unspecified elbow: Secondary | ICD-10-CM | POA: Diagnosis not present

## 2013-12-29 DIAGNOSIS — S4980XA Other specified injuries of shoulder and upper arm, unspecified arm, initial encounter: Secondary | ICD-10-CM | POA: Diagnosis not present

## 2013-12-29 DIAGNOSIS — S40012A Contusion of left shoulder, initial encounter: Secondary | ICD-10-CM

## 2013-12-29 MED ORDER — CEPHALEXIN 500 MG PO CAPS
500.0000 mg | ORAL_CAPSULE | Freq: Once | ORAL | Status: AC
  Administered 2013-12-29: 500 mg via ORAL

## 2013-12-29 MED ORDER — ALBUTEROL SULFATE HFA 108 (90 BASE) MCG/ACT IN AERS
2.0000 | INHALATION_SPRAY | Freq: Once | RESPIRATORY_TRACT | Status: AC
  Administered 2013-12-29: 2 via RESPIRATORY_TRACT
  Filled 2013-12-29: qty 6.7

## 2013-12-29 MED ORDER — TETANUS-DIPHTH-ACELL PERTUSSIS 5-2.5-18.5 LF-MCG/0.5 IM SUSP
0.5000 mL | Freq: Once | INTRAMUSCULAR | Status: AC
  Administered 2013-12-29: 0.5 mL via INTRAMUSCULAR

## 2013-12-29 MED ORDER — IBUPROFEN 800 MG PO TABS: 800.0000 mg | ORAL_TABLET | Freq: Three times a day (TID) | ORAL | Status: DC

## 2013-12-29 MED ORDER — CEPHALEXIN 500 MG PO CAPS: 500.0000 mg | ORAL_CAPSULE | Freq: Three times a day (TID) | ORAL | Status: DC

## 2013-12-29 MED ORDER — TETANUS-DIPHTH-ACELL PERTUSSIS 5-2.5-18.5 LF-MCG/0.5 IM SUSP
INTRAMUSCULAR | Status: AC
  Filled 2013-12-29: qty 0.5

## 2013-12-29 MED ORDER — CEPHALEXIN 500 MG PO CAPS
ORAL_CAPSULE | ORAL | Status: AC
  Filled 2013-12-29: qty 1

## 2013-12-29 MED ORDER — HYDROCODONE-ACETAMINOPHEN 5-325 MG PO TABS: ORAL_TABLET | ORAL | Status: DC

## 2013-12-29 NOTE — ED Provider Notes (Signed)
Medical screening examination/treatment/procedure(s) were performed by non-physician practitioner and as supervising physician I was immediately available for consultation/collaboration.   Dione Booze, MD 12/29/13 [phone]

## 2013-12-29 NOTE — Discharge Instructions (Signed)
Abrasions An abrasion is a cut or scrape of the skin. Abrasions do not go through all layers of the skin. HOME CARE  If a bandage (dressing) was put on your wound, change it as told by your doctor. If the bandage sticks, soak it off with warm.  Wash the area with water and soap 2 times a day. Rinse off the soap. Pat the area dry with a clean towel.  Put on medicated cream (ointment) as told by your doctor.  Change your bandage right away if it gets wet or dirty.  Only take medicine as told by your doctor.  See your doctor within 24-48 hours to get your wound checked.  Check your wound for redness, puffiness (swelling), or yellowish-white fluid (pus). GET HELP RIGHT AWAY IF:   You have more pain in the wound.  You have redness, swelling, or tenderness around the wound.  You have pus coming from the wound.  You have a fever or lasting symptoms for more than 2-3 days.  You have a fever and your symptoms suddenly get worse.  You have a bad smell coming from the wound or bandage. MAKE SURE YOU:   Understand these instructions.  Will watch your condition.  Will get help right away if you are not doing well or get worse. Document Released: 09/23/2007 Document Revised: 12/30/2011 Document Reviewed: 03/10/2011 [facility] Patient Information 2015 [place], [place]. This information is not intended to replace advice given to you by your health care provider. Make sure you discuss any questions you have with your health care provider.  Puncture Wound A puncture wound happens when a sharp object pokes a hole in the skin. A puncture wound can cause an infection because germs can go under the skin during the injury. HOME CARE   Change your bandage (dressing) once a day, or as told by your doctor. If the bandage sticks, soak it in water.  Keep all doctor visits as told.  Only take medicine as told by your doctor.  Take your medicine (antibiotics) as told. Finish them even if you start  to feel better. You may need a tetanus shot if:  You cannot remember when you had your last tetanus shot.  You have never had a tetanus shot. If you need a tetanus shot and you choose not to have one, you may get tetanus. Sickness from tetanus can be serious. You may need a rabies shot if an animal bite caused your wound. GET HELP RIGHT AWAY IF:   Your wound is red, puffy (swollen), or painful.  You see red lines on the skin near the wound.  You have a bad smell coming from the wound or bandage.  You have yellowish-white fluid (pus) coming from the wound.  Your medicine is not working.  You notice an object in the wound.  You have a fever.  You have severe pain.  You have trouble breathing.  You feel dizzy or pass out (faint).  You keep throwing up (vomiting).  You lose feeling (numbness) in your arm or leg, or you cannot move your arm or leg.  Your problems get worse. MAKE SURE YOU:   Understand these instructions.  Will watch your condition.  Will get help right away if you are not doing well or get worse. Document Released: 01/14/2008 Document Revised: 06/29/2011 Document Reviewed: 09/23/2010 [facility] Patient Information 2015 [place], [place]. This information is not intended to replace advice given to you by your health care provider. Make sure you discuss  any questions you have with your health care provider. ° °

## 2013-12-29 NOTE — ED Provider Notes (Signed)
CSN: [id number]     Arrival date & time 12/28/13  2203 History   First MD Initiated Contact with Patient 12/29/13 0034     Chief Complaint  Patient presents with  . Arm Pain     (Consider location/radiation/quality/duration/timing/severity/associated sxs/prior Treatment)  Gregory Cox is a 28 y.o. male who presents to the Emergency Department complaining of pain to the left forearm after a large wooden board fell from a height and a nail within the board punctured his left arm. Incident occurred earlier this evening and was not a work related injury.  He also states the board also struck his left shoulder and caused an abrasion to his forehead.  He states the cleaned the wounds and applied "a cream that was in a first aid kit."  He reports numbness and tingling sensation to his left thumb and pain to the left arm with movement.  He denies LOC, neck pain, headaches, vomiting.  He also reports being out of his albuterol inhaler and has a h/o asthma.  He denies shortness of breath or chest pain.     Patient is a 28 y.o. male presenting with arm pain.  Arm Pain Associated symptoms include arthralgias. Pertinent negatives include no chills, fever, headaches, joint swelling, nausea, neck pain, numbness, vomiting or weakness.    Past Medical History  Diagnosis Date  . Asthma   . Attention deficit disorder (ADD)   . Bipolar 1 disorder   . Acute kidney failure   . Insomnia    Past Surgical History  Procedure Laterality Date  . Wisdom tooth extraction     Family History  Problem Relation Age of Onset  . Diabetes Mother   . Hypertension Mother   . Diabetes Other   . Hypertension Other   . Kidney disease Other   . Kidney disease Father    History  Substance Use Topics  . Smoking status: Current Every Day Smoker -- 0.50 packs/day for 7 years    Types: Cigarettes  . Smokeless tobacco: Never Used  . Alcohol Use: Yes     Comment: occasional    Review of Systems  Constitutional:  Negative for fever and chills.  Eyes: Negative for visual disturbance.  Respiratory: Positive for wheezing. Negative for shortness of breath.   Gastrointestinal: Negative for nausea and vomiting.  Genitourinary: Negative for dysuria and difficulty urinating.  Musculoskeletal: Positive for arthralgias. Negative for back pain, joint swelling, neck pain and neck stiffness.  Skin: Positive for wound. Negative for color change.       Abrasion forehead, puncture wound left forearm  Neurological: Negative for dizziness, syncope, weakness, numbness and headaches.  All other systems reviewed and are negative.     Allergies  Fish allergy and Penicillins  Home Medications   Prior to Admission medications   Medication Sig Start Date End Date Taking? Authorizing Provider  Alum & Mag Hydroxide-Simeth (MAGIC MOUTHWASH W/LIDOCAINE) SOLN Take 5 mLs by mouth 3 (three) times daily as needed for mouth pain. 09/05/13   Teressa Lower, MD  ARIPiprazole (ABILIFY) 20 MG tablet Take 20 mg by mouth daily.    Historical Provider, MD  HYDROcodone-acetaminophen (HYCET) 7.5-325 mg/15 ml solution Take 10 mLs by mouth every 6 (six) hours as needed for moderate pain. 08/22/13   Evani Shrider L. Bilal Manzer, PA-C  HYDROcodone-acetaminophen (HYCET) 7.5-325 mg/15 ml solution Take 10 mLs by mouth every 6 (six) hours as needed for moderate pain or severe pain. 09/05/13   Teressa Lower, MD  ibuprofen (ADVIL,MOTRIN) 800  MG tablet Take 1 tablet (800 mg total) by mouth 3 (three) times daily. 08/22/13   Patton Swisher L. Jalayia Bagheri, PA-C  traZODone (DESYREL) 100 MG tablet Take 100 mg by mouth at bedtime.    Historical Provider, MD   BP 121/75  Pulse 72  Temp(Src) 98.5 F (36.9 C) (Oral)  Resp 18  Ht '6\' 2"'  (1.88 m)  Wt 225 lb (102.059 kg)  BMI 28.88 kg/m2  SpO2 98% Physical Exam  Nursing note and vitals reviewed. Constitutional: He is oriented to person, place, and time. He appears well-developed and well-nourished. No distress.  HENT:  Head:  Normocephalic and atraumatic.  Eyes: Conjunctivae and EOM are normal. Pupils are equal, round, and reactive to light.  Neck: Normal range of motion. Neck supple.  Cardiovascular: Normal rate, regular rhythm, normal heart sounds and intact distal pulses.   No murmur heard. Pulmonary/Chest: Effort normal. No respiratory distress. He has wheezes. He exhibits no tenderness.  Expiratory wheezes throughout.  No rales   Musculoskeletal: He exhibits tenderness. He exhibits no edema.       Left shoulder: He exhibits tenderness. He exhibits no bony tenderness, no swelling, no effusion, no crepitus, no deformity, no laceration, normal pulse and normal strength.       Arms: tenderness of the proximal left forearm.   Radial pulse is brisk, distal sensation intact.  CR< 2 sec.  No bruising or bony deformity. Pt has full ROM of the digits.  Compartments of the left UE are soft.  Neurological: He is alert and oriented to person, place, and time. He exhibits normal muscle tone. Coordination normal.  Skin: Skin is warm and dry.  Small puncture wound to the proximal left forearm.  No erythema, edema or bleeding.  Patient also has a superficial abrasion to the mid forehead w/o edema, bruising or bleeding.      ED Course  Procedures (including critical care time) Labs Review Labs Reviewed - No data to display  Imaging Review Dg Elbow Complete Left  12/29/2013   CLINICAL DATA:  Left elbow pain/injury  EXAM: LEFT ELBOW - COMPLETE 3+ VIEW  COMPARISON:  None.  FINDINGS: No fracture or dislocation is seen.  Well corticated osseous density along the medial aspect of the proximal ulna, not acute.  The joint spaces are preserved.  No displaced elbow joint fat pads to suggest an elbow joint effusion.  The visualized soft tissues are unremarkable.  IMPRESSION: No fracture or dislocation is seen.   Electronically Signed   By: Julian Hy M.D.   On: 12/29/2013 01:39   Dg Shoulder Left  12/29/2013   CLINICAL DATA:   Left shoulder pain/ injury  EXAM: LEFT SHOULDER - 2+ VIEW  COMPARISON:  None.  FINDINGS: No fracture or dislocation is seen.  The joint spaces are preserved.  The visualized soft tissues are unremarkable.  Visualized left lung is clear.  IMPRESSION: No fracture or dislocation is seen.   Electronically Signed   By: Julian Hy M.D.   On: 12/29/2013 01:32     EKG Interpretation None      MDM   Final diagnoses:  Puncture wound  Contusion shoulder/arm, left, initial encounter  Abrasion of forehead, initial encounter  Medication refill    Patient is well appearing, VSS.  Small puncture wound to the proximal left forearm w/o signs of infection at this time, NV intact.  Pt has h/o asthma and reports being out of his albuterol inhaler.  No respiratory distress noted, albuterol inhaler dispensed.  Td updated, wounds cleaned and bandaged.  No concerning sx's for compartment syndrome.  Pt agrees to keflex, proper wound care, and close f/u with PMD in 2 days for recheck also advised to return here in 1-2 days for any worsening symptoms.     Anina Schnake L. Stirling Orton, PA-C 12/29/13 0144

## 2014-06-01 DIAGNOSIS — N342 Other urethritis: Secondary | ICD-10-CM | POA: Diagnosis not present

## 2014-06-06 DIAGNOSIS — A5903 Trichomonal cystitis and urethritis: Secondary | ICD-10-CM | POA: Diagnosis not present

## 2014-06-20 DIAGNOSIS — F319 Bipolar disorder, unspecified: Secondary | ICD-10-CM | POA: Diagnosis not present

## 2014-09-26 DIAGNOSIS — H1031 Unspecified acute conjunctivitis, right eye: Secondary | ICD-10-CM | POA: Diagnosis not present

## 2014-09-26 DIAGNOSIS — F319 Bipolar disorder, unspecified: Secondary | ICD-10-CM | POA: Diagnosis not present

## 2014-09-26 DIAGNOSIS — F209 Schizophrenia, unspecified: Secondary | ICD-10-CM | POA: Diagnosis not present

## 2014-09-26 DIAGNOSIS — Z91018 Allergy to other foods: Secondary | ICD-10-CM | POA: Diagnosis not present

## 2014-09-26 DIAGNOSIS — Z888 Allergy status to other drugs, medicaments and biological substances status: Secondary | ICD-10-CM | POA: Diagnosis not present

## 2014-09-26 DIAGNOSIS — Z79899 Other long term (current) drug therapy: Secondary | ICD-10-CM | POA: Diagnosis not present

## 2014-11-08 ENCOUNTER — Emergency Department (HOSPITAL_COMMUNITY): Payer: No Typology Code available for payment source

## 2014-11-08 ENCOUNTER — Encounter (HOSPITAL_COMMUNITY): Payer: Self-pay | Admitting: *Deleted

## 2014-11-08 ENCOUNTER — Emergency Department (HOSPITAL_COMMUNITY)
Admission: EM | Admit: 2014-11-08 | Discharge: 2014-11-08 | Disposition: A | Discharge status: Home / Self Care | Payer: No Typology Code available for payment source | Attending: Emergency Medicine | Admitting: Emergency Medicine

## 2014-11-08 DIAGNOSIS — S299XXA Unspecified injury of thorax, initial encounter: Secondary | ICD-10-CM | POA: Insufficient documentation

## 2014-11-08 DIAGNOSIS — Y92410 Unspecified street and highway as the place of occurrence of the external cause: Secondary | ICD-10-CM | POA: Diagnosis not present

## 2014-11-08 DIAGNOSIS — Y9389 Activity, other specified: Secondary | ICD-10-CM | POA: Diagnosis not present

## 2014-11-08 DIAGNOSIS — Z720 Tobacco use: Secondary | ICD-10-CM | POA: Insufficient documentation

## 2014-11-08 DIAGNOSIS — S6992XA Unspecified injury of left wrist, hand and finger(s), initial encounter: Secondary | ICD-10-CM | POA: Diagnosis not present

## 2014-11-08 DIAGNOSIS — J45909 Unspecified asthma, uncomplicated: Secondary | ICD-10-CM | POA: Diagnosis not present

## 2014-11-08 DIAGNOSIS — M79602 Pain in left arm: Secondary | ICD-10-CM | POA: Diagnosis not present

## 2014-11-08 DIAGNOSIS — Z87448 Personal history of other diseases of urinary system: Secondary | ICD-10-CM | POA: Diagnosis not present

## 2014-11-08 DIAGNOSIS — R079 Chest pain, unspecified: Secondary | ICD-10-CM

## 2014-11-08 DIAGNOSIS — Z791 Long term (current) use of non-steroidal anti-inflammatories (NSAID): Secondary | ICD-10-CM | POA: Insufficient documentation

## 2014-11-08 DIAGNOSIS — F319 Bipolar disorder, unspecified: Secondary | ICD-10-CM | POA: Insufficient documentation

## 2014-11-08 DIAGNOSIS — M549 Dorsalgia, unspecified: Secondary | ICD-10-CM | POA: Diagnosis not present

## 2014-11-08 DIAGNOSIS — Z880 Allergy status to penicillin: Secondary | ICD-10-CM | POA: Diagnosis not present

## 2014-11-08 DIAGNOSIS — Y998 Other external cause status: Secondary | ICD-10-CM | POA: Diagnosis not present

## 2014-11-08 DIAGNOSIS — S3993XA Unspecified injury of pelvis, initial encounter: Secondary | ICD-10-CM | POA: Diagnosis not present

## 2014-11-08 DIAGNOSIS — S199XXA Unspecified injury of neck, initial encounter: Secondary | ICD-10-CM | POA: Diagnosis not present

## 2014-11-08 DIAGNOSIS — S59902A Unspecified injury of left elbow, initial encounter: Secondary | ICD-10-CM | POA: Diagnosis not present

## 2014-11-08 DIAGNOSIS — Z79899 Other long term (current) drug therapy: Secondary | ICD-10-CM | POA: Diagnosis not present

## 2014-11-08 DIAGNOSIS — R52 Pain, unspecified: Secondary | ICD-10-CM | POA: Diagnosis not present

## 2014-11-08 DIAGNOSIS — S0990XA Unspecified injury of head, initial encounter: Secondary | ICD-10-CM | POA: Insufficient documentation

## 2014-11-08 DIAGNOSIS — M25532 Pain in left wrist: Secondary | ICD-10-CM

## 2014-11-08 DIAGNOSIS — R0789 Other chest pain: Secondary | ICD-10-CM | POA: Diagnosis not present

## 2014-11-08 DIAGNOSIS — M25552 Pain in left hip: Secondary | ICD-10-CM | POA: Diagnosis not present

## 2014-11-08 DIAGNOSIS — M25522 Pain in left elbow: Secondary | ICD-10-CM | POA: Diagnosis not present

## 2014-11-08 DIAGNOSIS — S59912A Unspecified injury of left forearm, initial encounter: Secondary | ICD-10-CM | POA: Diagnosis not present

## 2014-11-08 DIAGNOSIS — M25579 Pain in unspecified ankle and joints of unspecified foot: Secondary | ICD-10-CM | POA: Diagnosis not present

## 2014-11-08 DIAGNOSIS — G4700 Insomnia, unspecified: Secondary | ICD-10-CM | POA: Insufficient documentation

## 2014-11-08 LAB — COMPREHENSIVE METABOLIC PANEL
ALT: 22 U/L (ref 17–63)
AST: 22 U/L (ref 15–41)
Albumin: 4.2 g/dL (ref 3.5–5.0)
Alkaline Phosphatase: 92 U/L (ref 38–126)
Anion gap: 11 (ref 5–15)
BUN: 10 mg/dL (ref 6–20)
CO2: 23 mmol/L (ref 22–32)
Calcium: 9.5 mg/dL (ref 8.9–10.3)
Chloride: 108 mmol/L (ref 101–111)
Creatinine, Ser: 0.95 mg/dL (ref 0.61–1.24)
GFR calc Af Amer: >60 mL/min (ref >60)
GFR calc non Af Amer: >60 mL/min (ref >60)
Glucose, Bld: 85 mg/dL (ref 65–99)
Potassium: 3.8 mmol/L (ref 3.5–5.1)
Sodium: 142 mmol/L (ref 135–145)
Total Bilirubin: 0.8 mg/dL (ref 0.3–1.2)
Total Protein: 7.2 g/dL (ref 6.5–8.1)

## 2014-11-08 LAB — CBC WITH DIFFERENTIAL/PLATELET
Basophils Absolute: 0 10*3/uL (ref 0.0–0.1)
Basophils Relative: 0 % (ref 0–1)
Eosinophils Absolute: 0.1 10*3/uL (ref 0.0–0.7)
Eosinophils Relative: 2 % (ref 0–5)
HCT: 46 % (ref 39.0–52.0)
Hemoglobin: 15.2 g/dL (ref 13.0–17.0)
Lymphocytes Relative: 35 % (ref 12–46)
Lymphs Abs: 2 10*3/uL (ref 0.7–4.0)
MCH: 28.3 pg (ref 26.0–34.0)
MCHC: 33 g/dL (ref 30.0–36.0)
MCV: 85.7 fL (ref 78.0–100.0)
Monocytes Absolute: 0.3 10*3/uL (ref 0.1–1.0)
Monocytes Relative: 5 % (ref 3–12)
Neutro Abs: 3.2 10*3/uL (ref 1.7–7.7)
Neutrophils Relative %: 58 % (ref 43–77)
Platelets: 184 10*3/uL (ref 150–400)
RBC: 5.37 MIL/uL (ref 4.22–5.81)
RDW: 12.6 % (ref 11.5–15.5)
WBC: 5.6 10*3/uL (ref 4.0–10.5)

## 2014-11-08 LAB — PROTIME-INR
INR: 1.04 (ref 0.00–1.49)
Prothrombin Time: 13.8 seconds (ref 11.6–15.2)

## 2014-11-08 MED ORDER — DIAZEPAM 5 MG/ML IJ SOLN: 5.0000 mg | Freq: Once | INTRAMUSCULAR | Status: DC

## 2014-11-08 MED ORDER — FENTANYL CITRATE (PF) 100 MCG/2ML IJ SOLN
INTRAMUSCULAR | Status: AC
  Filled 2014-11-08: qty 2

## 2014-11-08 MED ORDER — IBUPROFEN 800 MG PO TABS: 800.0000 mg | ORAL_TABLET | Freq: Three times a day (TID) | ORAL | Status: DC | PRN

## 2014-11-08 MED ORDER — DIAZEPAM 5 MG PO TABS
10.0000 mg | ORAL_TABLET | Freq: Once | ORAL | Status: AC
  Administered 2014-11-08: 10 mg via ORAL
  Filled 2014-11-08: qty 2

## 2014-11-08 MED ORDER — CYCLOBENZAPRINE HCL 10 MG PO TABS: 10.0000 mg | ORAL_TABLET | Freq: Two times a day (BID) | ORAL | Status: DC | PRN

## 2014-11-08 MED ORDER — FENTANYL CITRATE (PF) 100 MCG/2ML IJ SOLN
50.0000 ug | INTRAMUSCULAR | Status: DC | PRN
  Administered 2014-11-08: 50 ug via INTRAVENOUS
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
## 2014-11-08 NOTE — ED Notes (Signed)
Patient transported to CT 

## 2014-11-15 DIAGNOSIS — M25552 Pain in left hip: Secondary | ICD-10-CM | POA: Diagnosis not present

## 2014-11-27 ENCOUNTER — Ambulatory Visit (HOSPITAL_COMMUNITY): Payer: Medicare Other | Attending: Internal Medicine | Admitting: Physical Therapy

## 2014-11-27 ENCOUNTER — Ambulatory Visit (HOSPITAL_COMMUNITY): Payer: Self-pay | Admitting: Physical Therapy

## 2014-11-27 DIAGNOSIS — M6281 Muscle weakness (generalized): Secondary | ICD-10-CM

## 2014-11-27 DIAGNOSIS — M6289 Other specified disorders of muscle: Secondary | ICD-10-CM | POA: Insufficient documentation

## 2014-11-27 DIAGNOSIS — R262 Difficulty in walking, not elsewhere classified: Secondary | ICD-10-CM | POA: Insufficient documentation

## 2014-11-27 DIAGNOSIS — R293 Abnormal posture: Secondary | ICD-10-CM | POA: Insufficient documentation

## 2014-11-27 DIAGNOSIS — R29898 Other symptoms and signs involving the musculoskeletal system: Secondary | ICD-10-CM | POA: Insufficient documentation

## 2014-11-27 DIAGNOSIS — M25552 Pain in left hip: Secondary | ICD-10-CM | POA: Insufficient documentation

## 2014-11-27 NOTE — Patient Instructions (Signed)
   BRIDGING  While lying on your back, tighten your lower abdominals, squeeze your buttocks and then raise your buttocks off the floor/bed as creating a "Bridge" with your body.  Repeat 5-10 times, twice a day within pain limits.     HIP ABDUCTION - STANDING   While standing, raise your leg out to the side. Keep your knee straight and maintain your toes pointed forward the entire time.    Use your arms for support if needed for balance and safety.   Repeat 5-10 times within pain limits, twice a day.    HIP EXTENSION - STANDING  While standing, move your leg back as shown.  Use your arms for support if needed for balance and safety.  Repeat 5-10 times, twice a day within pain limits.   Flexors, Standing   Stand, one foot on your first or second step. Use support for balance, if needed. Slowly shift weight toward raised knee until stretch is felt in the front of your back leg. Hold _20-30__ seconds.  Repeat __2_ times per session. Do __2_ sessions per day.  Copyright  V[employer]. All rights reserved.
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
## 2014-12-04 ENCOUNTER — Ambulatory Visit (HOSPITAL_COMMUNITY): Payer: Medicare Other | Admitting: Physical Therapy

## 2014-12-04 DIAGNOSIS — M25552 Pain in left hip: Secondary | ICD-10-CM

## 2014-12-04 DIAGNOSIS — R29898 Other symptoms and signs involving the musculoskeletal system: Secondary | ICD-10-CM | POA: Diagnosis not present

## 2014-12-04 DIAGNOSIS — R262 Difficulty in walking, not elsewhere classified: Secondary | ICD-10-CM

## 2014-12-04 DIAGNOSIS — R293 Abnormal posture: Secondary | ICD-10-CM

## 2014-12-04 DIAGNOSIS — M6289 Other specified disorders of muscle: Secondary | ICD-10-CM | POA: Diagnosis not present

## 2014-12-04 DIAGNOSIS — M6281 Muscle weakness (generalized): Secondary | ICD-10-CM
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
## 2014-12-06 ENCOUNTER — Ambulatory Visit (HOSPITAL_COMMUNITY): Payer: Medicare Other | Admitting: Physical Therapy

## 2014-12-06 DIAGNOSIS — M6281 Muscle weakness (generalized): Secondary | ICD-10-CM

## 2014-12-06 DIAGNOSIS — R29898 Other symptoms and signs involving the musculoskeletal system: Secondary | ICD-10-CM | POA: Diagnosis not present

## 2014-12-06 DIAGNOSIS — R293 Abnormal posture: Secondary | ICD-10-CM | POA: Diagnosis not present

## 2014-12-06 DIAGNOSIS — M25552 Pain in left hip: Secondary | ICD-10-CM

## 2014-12-06 DIAGNOSIS — R262 Difficulty in walking, not elsewhere classified: Secondary | ICD-10-CM | POA: Diagnosis not present

## 2014-12-06 DIAGNOSIS — M6289 Other specified disorders of muscle: Secondary | ICD-10-CM | POA: Diagnosis not present
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
## 2014-12-10 ENCOUNTER — Ambulatory Visit (HOSPITAL_COMMUNITY): Payer: Medicare Other | Admitting: Physical Therapy

## 2014-12-10 DIAGNOSIS — R262 Difficulty in walking, not elsewhere classified: Secondary | ICD-10-CM | POA: Diagnosis not present

## 2014-12-10 DIAGNOSIS — M25552 Pain in left hip: Secondary | ICD-10-CM

## 2014-12-10 DIAGNOSIS — R29898 Other symptoms and signs involving the musculoskeletal system: Secondary | ICD-10-CM

## 2014-12-10 DIAGNOSIS — R293 Abnormal posture: Secondary | ICD-10-CM

## 2014-12-10 DIAGNOSIS — M6281 Muscle weakness (generalized): Secondary | ICD-10-CM

## 2014-12-10 DIAGNOSIS — M6289 Other specified disorders of muscle: Secondary | ICD-10-CM | POA: Diagnosis not present
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
## 2014-12-12 ENCOUNTER — Ambulatory Visit (HOSPITAL_COMMUNITY): Payer: Medicare Other | Admitting: Physical Therapy

## 2014-12-12 DIAGNOSIS — M6289 Other specified disorders of muscle: Secondary | ICD-10-CM | POA: Diagnosis not present

## 2014-12-12 DIAGNOSIS — R29898 Other symptoms and signs involving the musculoskeletal system: Secondary | ICD-10-CM | POA: Diagnosis not present

## 2014-12-12 DIAGNOSIS — R262 Difficulty in walking, not elsewhere classified: Secondary | ICD-10-CM | POA: Diagnosis not present

## 2014-12-12 DIAGNOSIS — M6281 Muscle weakness (generalized): Secondary | ICD-10-CM

## 2014-12-12 DIAGNOSIS — R293 Abnormal posture: Secondary | ICD-10-CM

## 2014-12-12 DIAGNOSIS — M25552 Pain in left hip: Secondary | ICD-10-CM
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
## 2014-12-13 DIAGNOSIS — M549 Dorsalgia, unspecified: Secondary | ICD-10-CM | POA: Diagnosis not present

## 2014-12-14 ENCOUNTER — Ambulatory Visit (HOSPITAL_COMMUNITY): Payer: Medicare Other

## 2014-12-14 ENCOUNTER — Encounter (HOSPITAL_COMMUNITY): Payer: Medicare Other | Admitting: Physical Therapy

## 2014-12-14 ENCOUNTER — Telehealth (HOSPITAL_COMMUNITY): Payer: Self-pay

## 2014-12-14 NOTE — Telephone Encounter (Signed)
No show, called and left message informing missed apt, included next apt date and time.  Left contact info.  [address], LPTA; CBIS [phone]

## 2014-12-18 ENCOUNTER — Telehealth (HOSPITAL_COMMUNITY): Payer: Self-pay | Admitting: Physical Therapy

## 2014-12-18 ENCOUNTER — Ambulatory Visit (HOSPITAL_COMMUNITY): Payer: Medicare Other | Admitting: Physical Therapy

## 2014-12-20 ENCOUNTER — Encounter (HOSPITAL_COMMUNITY): Payer: Self-pay | Admitting: Occupational Therapy

## 2014-12-20 ENCOUNTER — Ambulatory Visit (HOSPITAL_COMMUNITY): Payer: Medicare Other | Attending: Internal Medicine | Admitting: Physical Therapy

## 2014-12-20 ENCOUNTER — Ambulatory Visit (HOSPITAL_COMMUNITY): Payer: Medicare Other | Admitting: Occupational Therapy

## 2014-12-20 DIAGNOSIS — R29898 Other symptoms and signs involving the musculoskeletal system: Secondary | ICD-10-CM | POA: Diagnosis not present

## 2014-12-20 DIAGNOSIS — R202 Paresthesia of skin: Secondary | ICD-10-CM | POA: Insufficient documentation

## 2014-12-20 DIAGNOSIS — M6281 Muscle weakness (generalized): Secondary | ICD-10-CM | POA: Insufficient documentation

## 2014-12-20 DIAGNOSIS — M25552 Pain in left hip: Secondary | ICD-10-CM

## 2014-12-20 DIAGNOSIS — M25522 Pain in left elbow: Secondary | ICD-10-CM

## 2014-12-20 DIAGNOSIS — M79622 Pain in left upper arm: Secondary | ICD-10-CM | POA: Diagnosis not present

## 2014-12-20 DIAGNOSIS — R278 Other lack of coordination: Secondary | ICD-10-CM

## 2014-12-20 DIAGNOSIS — M6289 Other specified disorders of muscle: Secondary | ICD-10-CM | POA: Diagnosis not present

## 2014-12-20 DIAGNOSIS — R279 Unspecified lack of coordination: Secondary | ICD-10-CM | POA: Insufficient documentation

## 2014-12-20 DIAGNOSIS — R293 Abnormal posture: Secondary | ICD-10-CM | POA: Diagnosis not present

## 2014-12-20 DIAGNOSIS — R200 Anesthesia of skin: Secondary | ICD-10-CM

## 2014-12-20 DIAGNOSIS — R262 Difficulty in walking, not elsewhere classified: Secondary | ICD-10-CM

## 2014-12-20 NOTE — Patient Instructions (Signed)
Home Exercises Program Theraputty Exercises  Do the following exercises 2 times a day using your affected hand.  1. Roll putty into a ball.  2. Make into a pancake.  3. Roll putty into a roll.  4. Pinch along log with first finger and thumb.   5. Make into a ball.  6. Roll it back into a log.   7. Pinch using thumb and side of first finger.  8. Roll into a ball, then flatten into a pancake.  9. Using your fingers, make putty into a mountain.   
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
## 2014-12-25 ENCOUNTER — Ambulatory Visit (HOSPITAL_COMMUNITY): Payer: Medicare Other | Admitting: Physical Therapy

## 2014-12-27 ENCOUNTER — Ambulatory Visit (HOSPITAL_COMMUNITY): Payer: Medicare Other | Admitting: Physical Therapy

## 2014-12-27 ENCOUNTER — Telehealth (HOSPITAL_COMMUNITY): Payer: Self-pay | Admitting: Physical Therapy

## 2014-12-27 NOTE — Telephone Encounter (Signed)
Called to check on patient ask for return phone call to confirm if he will continue treatment. NF 12/27/14

## 2014-12-27 NOTE — Therapy (Addendum)
[place], [place], [address] Phone: [phone]   Fax:  [phone]  Patient Details  Name: Gregory Cox MRN: [id number] Date of Birth: Aug 26, 1985 Referring Provider:  Rosita Fire, MD  Encounter Date: 12/27/2014  PHYSICAL THERAPY DISCHARGE SUMMARY  Visits from Start of Care: 6  Current functional level related to goals / functional outcomes: Patient continues to experience significant pain in hip as well as functional weakness and gait impairment; per front desk staff, insurance company has closed case and may not pay for further treatment.    Remaining deficits: Unable to assess as patient has not returned since 12/20/14   Education / Equipment: Left message detailing policy of DC after 2 consecutive no-shows, advised that if he feels he needs further skilled PT services that he will need new MD order.  Plan: Patient agrees to discharge.  Patient goals were not met. Patient is being discharged due to not returning since the last visit.  ?????       Deniece Ree PT, DPT [facility] [address] [place], [place], [address] Phone: [phone]   Fax:  [phone]

## 2015-01-01 ENCOUNTER — Encounter (HOSPITAL_COMMUNITY): Payer: Medicare Other

## 2015-01-01 ENCOUNTER — Ambulatory Visit (HOSPITAL_COMMUNITY): Payer: Medicare Other | Admitting: Physical Therapy

## 2015-01-03 ENCOUNTER — Encounter (HOSPITAL_COMMUNITY): Payer: Medicare Other

## 2015-01-03 ENCOUNTER — Encounter (HOSPITAL_COMMUNITY): Payer: Medicare Other | Admitting: Physical Therapy

## 2015-01-08 ENCOUNTER — Encounter (HOSPITAL_COMMUNITY): Payer: Medicare Other

## 2015-01-08 ENCOUNTER — Encounter (HOSPITAL_COMMUNITY): Payer: Medicare Other | Admitting: Physical Therapy

## 2015-01-10 ENCOUNTER — Encounter (HOSPITAL_COMMUNITY): Payer: Medicare Other

## 2015-01-15 ENCOUNTER — Encounter (HOSPITAL_COMMUNITY): Payer: Medicare Other

## 2015-01-17 ENCOUNTER — Encounter (HOSPITAL_COMMUNITY): Payer: Medicare Other | Admitting: Occupational Therapy

## 2015-01-17 ENCOUNTER — Encounter (HOSPITAL_COMMUNITY): Payer: Medicare Other | Admitting: Physical Therapy

## 2015-01-22 ENCOUNTER — Encounter (HOSPITAL_COMMUNITY): Payer: Medicare Other | Admitting: Physical Therapy

## 2015-01-22 ENCOUNTER — Encounter (HOSPITAL_COMMUNITY): Payer: Medicare Other

## 2015-01-24 ENCOUNTER — Encounter (HOSPITAL_COMMUNITY): Payer: Medicare Other

## 2015-01-24 ENCOUNTER — Encounter (HOSPITAL_COMMUNITY): Payer: Medicare Other | Admitting: Occupational Therapy

## 2015-01-29 ENCOUNTER — Encounter (HOSPITAL_COMMUNITY): Payer: Medicare Other | Admitting: Physical Therapy

## 2015-01-29 ENCOUNTER — Encounter (HOSPITAL_COMMUNITY): Payer: Medicare Other | Admitting: Occupational Therapy

## 2015-01-31 ENCOUNTER — Encounter (HOSPITAL_COMMUNITY): Payer: Medicare Other | Admitting: Physical Therapy

## 2015-01-31 ENCOUNTER — Encounter (HOSPITAL_COMMUNITY): Payer: Medicare Other | Admitting: Occupational Therapy

## 2015-02-05 ENCOUNTER — Encounter (HOSPITAL_COMMUNITY): Payer: Medicare Other | Admitting: Occupational Therapy

## 2015-02-05 ENCOUNTER — Encounter (HOSPITAL_COMMUNITY): Payer: Medicare Other | Admitting: Physical Therapy

## 2015-02-07 ENCOUNTER — Encounter (HOSPITAL_COMMUNITY): Payer: Medicare Other | Admitting: Occupational Therapy

## 2015-02-07 ENCOUNTER — Encounter (HOSPITAL_COMMUNITY): Payer: Medicare Other | Admitting: Physical Therapy

## 2015-02-08 ENCOUNTER — Encounter (HOSPITAL_COMMUNITY): Payer: Self-pay

## 2015-02-08 NOTE — Therapy (Signed)
[place], [place], [address] Phone: [phone]   Fax:  [phone]  Patient Details  Name: Gregory Cox MRN: [id number] Date of Birth: 03/12/1986 Referring Provider:  No ref. provider found  Encounter Date: 02/08/2015 OCCUPATIONAL THERAPY DISCHARGE SUMMARY  Visits from Start of Care: 1  Current functional level related to goals / functional outcomes: OT LONG TERM GOAL #1    Title Pt will return to prior level of functioning and independence in daily and work tasks.    Time 8   Period Weeks   Status New   OT LONG TERM GOAL #2   Title Pt will decrease pain in LUE to 2/10 or less during daily tasks.    Time 8   Period Weeks   Status New   OT LONG TERM GOAL #3   Title Pt will increase LUE strength to 5/5 to increase ability to lift heavy objects at work.    Time 8   Period Weeks   Status New   OT LONG TERM GOAL #4   Title Pt will increase LUE grip strength by 40# and pinch strength by 8# to increase ability to grip and maintain hold on heavy objects.    Time 8   Period Weeks   Status New   OT LONG TERM GOAL #5   Title Pt will increase coordination by completing 9 hold peg test in under 25" to increase ability to complete fine motor tasks at work.    Time 8   Period Weeks   Status New          Remaining deficits: Pt did not return to OT after initial evaluation completed on 12/20/14. Pt discharged due to failure to return to clinic.     Plan: Patient agrees to discharge.  Patient goals were not met. Patient is being discharged due to not returning since the last visit.  ?????       Ailene Ravel, OTR/L,CBIS  [phone]  02/08/2015, 3:17 PM  [place] [facility] [address] [place], [place], [address] Phone: [phone]   Fax:  [phone]

## 2015-03-06 DIAGNOSIS — N342 Other urethritis: Secondary | ICD-10-CM | POA: Diagnosis not present

## 2015-03-06 DIAGNOSIS — A5903 Trichomonal cystitis and urethritis: Secondary | ICD-10-CM | POA: Diagnosis not present

## 2015-03-06 DIAGNOSIS — K649 Unspecified hemorrhoids: Secondary | ICD-10-CM | POA: Diagnosis not present

## 2015-09-14 ENCOUNTER — Emergency Department (HOSPITAL_COMMUNITY)
Admission: EM | Admit: 2015-09-14 | Discharge: 2015-09-14 | Disposition: A | Discharge status: Home / Self Care | Payer: Medicare Other | Attending: Emergency Medicine | Admitting: Emergency Medicine

## 2015-09-14 ENCOUNTER — Encounter (HOSPITAL_COMMUNITY): Payer: Self-pay | Admitting: Emergency Medicine

## 2015-09-14 DIAGNOSIS — F909 Attention-deficit hyperactivity disorder, unspecified type: Secondary | ICD-10-CM | POA: Diagnosis not present

## 2015-09-14 DIAGNOSIS — Z791 Long term (current) use of non-steroidal anti-inflammatories (NSAID): Secondary | ICD-10-CM | POA: Diagnosis not present

## 2015-09-14 DIAGNOSIS — K029 Dental caries, unspecified: Secondary | ICD-10-CM | POA: Insufficient documentation

## 2015-09-14 DIAGNOSIS — F17210 Nicotine dependence, cigarettes, uncomplicated: Secondary | ICD-10-CM | POA: Diagnosis not present

## 2015-09-14 DIAGNOSIS — K0889 Other specified disorders of teeth and supporting structures: Secondary | ICD-10-CM | POA: Diagnosis present

## 2015-09-14 DIAGNOSIS — J45909 Unspecified asthma, uncomplicated: Secondary | ICD-10-CM | POA: Insufficient documentation

## 2015-09-14 DIAGNOSIS — F319 Bipolar disorder, unspecified: Secondary | ICD-10-CM | POA: Insufficient documentation

## 2015-09-14 MED ORDER — IBUPROFEN 600 MG PO TABS: 600.0000 mg | ORAL_TABLET | Freq: Four times a day (QID) | ORAL | Status: DC | PRN

## 2015-09-14 MED ORDER — CLINDAMYCIN HCL 300 MG PO CAPS: 300.0000 mg | ORAL_CAPSULE | Freq: Three times a day (TID) | ORAL | Status: DC

## 2015-09-14 MED ORDER — CLINDAMYCIN HCL 150 MG PO CAPS
300.0000 mg | ORAL_CAPSULE | Freq: Once | ORAL | Status: AC
  Administered 2015-09-14: 300 mg via ORAL
  Filled 2015-09-14: qty 2

## 2015-09-14 MED ORDER — IBUPROFEN 800 MG PO TABS
800.0000 mg | ORAL_TABLET | Freq: Once | ORAL | Status: AC
  Administered 2015-09-14: 800 mg via ORAL
  Filled 2015-09-14: qty 1

## 2015-09-14 NOTE — ED Notes (Signed)
Pt made aware to return if symptoms worsen or if any life threatening symptoms occur.   

## 2015-09-14 NOTE — Discharge Instructions (Signed)
Dental Care and Dentist Visits °Dental care supports good overall health. Regular dental visits can also help you avoid dental pain, bleeding, infection, and other more serious health problems in the future. It is important to keep the mouth healthy because diseases in the teeth, gums, and other oral tissues can spread to other areas of the body. Some problems, such as diabetes, heart disease, and pre-term labor have been associated with poor oral health.  °See your dentist every 6 months. If you experience emergency problems such as a toothache or broken tooth, go to the dentist right away. If you see your dentist regularly, you may catch problems early. It is easier to be treated for problems in the early stages.  °WHAT TO EXPECT AT A DENTIST VISIT  °Your dentist will look for many common oral health problems and recommend proper treatment. At your regular dental visit, you can expect: °· Gentle cleaning of the teeth and gums. This includes scraping and polishing. This helps to remove the sticky substance around the teeth and gums (plaque). Plaque forms in the mouth shortly after eating. Over time, plaque hardens on the teeth as tartar. If tartar is not removed regularly, it can cause problems. Cleaning also helps remove stains. °· Periodic X-rays. These pictures of the teeth and supporting bone will help your dentist assess the health of your teeth. °· Periodic fluoride treatments. Fluoride is a natural mineral shown to help strengthen teeth. Fluoride treatment involves applying a fluoride gel or varnish to the teeth. It is most commonly done in children. °· Examination of the mouth, tongue, jaws, teeth, and gums to look for any oral health problems, such as: °· Cavities (dental caries). This is decay on the tooth caused by plaque, sugar, and acid in the mouth. It is best to catch a cavity when it is small. °· Inflammation of the gums caused by plaque buildup (gingivitis). °· Problems with the mouth or malformed  or misaligned teeth. °· Oral cancer or other diseases of the soft tissues or jaws.  °KEEP YOUR TEETH AND GUMS HEALTHY °For healthy teeth and gums, follow these general guidelines as well as your dentist's specific advice: °· Have your teeth professionally cleaned at the dentist every 6 months. °· Brush twice daily with a fluoride toothpaste. °· Floss your teeth daily.  °· Ask your dentist if you need fluoride supplements, treatments, or fluoride toothpaste. °· Eat a healthy diet. Reduce foods and drinks with added sugar. °· Avoid smoking. °TREATMENT FOR ORAL HEALTH PROBLEMS °If you have oral health problems, treatment varies depending on the conditions present in your teeth and gums. °· Your caregiver will most likely recommend good oral hygiene at each visit. °· For cavities, gingivitis, or other oral health disease, your caregiver will perform a procedure to treat the problem. This is typically done at a separate appointment. Sometimes your caregiver will refer you to another dental specialist for specific tooth problems or for surgery. °SEEK IMMEDIATE DENTAL CARE IF: °· You have pain, bleeding, or soreness in the gum, tooth, jaw, or mouth area. °· A permanent tooth becomes loose or separated from the gum socket. °· You experience a blow or injury to the mouth or jaw area. °  °This information is not intended to replace advice given to you by your health care provider. Make sure you discuss any questions you have with your health care provider. °  °Document Released: 12/17/2010 Document Revised: 06/29/2011 Document Reviewed: 12/17/2010 °Elsevier Interactive Patient Education ©2016 Elsevier Inc. ° °Dental Caries °Dental   caries is tooth decay. This decay can cause a hole in teeth (cavity) that can get bigger and deeper over time. HOME CARE  Brush and floss your teeth. Do this at least two times a day.  Use a fluoride toothpaste.  Use a mouth rinse if told by your dentist or doctor.  Eat less sugary and  starchy foods. Drink less sugary drinks.  Avoid snacking often on sugary and starchy foods. Avoid sipping often on sugary drinks.  Keep regular checkups and cleanings with your dentist.  Use fluoride supplements if told by your dentist or doctor.  Allow fluoride to be applied to teeth if told by your dentist or doctor.   This information is not intended to replace advice given to you by your health care provider. Make sure you discuss any questions you have with your health care provider.   Document Released: 01/14/2008 Document Revised: 04/27/2014 Document Reviewed: 04/08/2012 Elsevier Interactive Patient Education [employer]2016 Elsevier Inc.

## 2015-09-14 NOTE — ED Notes (Signed)
Having increasing dental pain over the last 7 days to let lower teeth.  Rates pain 10/10.

## 2015-09-14 NOTE — ED Provider Notes (Signed)
CSN: [id number]     Arrival date & time 09/14/15  0848 History   First MD Initiated Contact with Patient 09/14/15 [phone]     Chief Complaint  Patient presents with  . Dental Pain     (Consider location/radiation/quality/duration/timing/severity/associated sxs/prior Treatment) The history is provided by the patient.   Gregory Cox is a 30 y.o. male presenting with a 1 week history of worsening dental pain although states he has had problems with the tooth involved for about a year.    The patient has a history of a cavity in his left second lower premolar tooth which needs to be repaired.  He had an appointment with the [facility] clinic at [facility] [place] but unfortunately missed his appointment and therefore has to "start over" on their waiting list to be seen.  He has taking hydrocdoone 10 mg tab last night with no relief of symptoms and another dose this am, again with no relief.  There has been no fevers, chills, nausea or vomiting, also no complaint of difficulty swallowing, although chewing makes pain worse.        Past Medical History  Diagnosis Date  . Asthma   . Attention deficit disorder (ADD)   . Bipolar 1 disorder (HCC)   . Acute kidney failure (HCC)   . Insomnia    Past Surgical History  Procedure Laterality Date  . Wisdom tooth extraction     Family History  Problem Relation Age of Onset  . Diabetes Mother   . Hypertension Mother   . Diabetes Other   . Hypertension Other   . Kidney disease Other   . Kidney disease Father    Social History  Substance Use Topics  . Smoking status: Current Every Day Smoker -- 0.50 packs/day for 7 years    Types: Cigarettes  . Smokeless tobacco: Never Used  . Alcohol Use: Yes     Comment: occasional    Review of Systems  Constitutional: Negative for fever.  HENT: Positive for dental problem. Negative for facial swelling and sore throat.   Respiratory: Negative for shortness of breath.   Musculoskeletal: Negative for neck pain  and neck stiffness.      Allergies  Fish allergy and Penicillins  Home Medications   Prior to Admission medications   Medication Sig Start Date End Date Taking? Authorizing Provider  Alum & Mag Hydroxide-Simeth (MAGIC MOUTHWASH W/LIDOCAINE) SOLN Take 5 mLs by mouth 3 (three) times daily as needed for mouth pain. 09/05/13   Sunnie Nielsen, MD  ARIPiprazole (ABILIFY) 20 MG tablet Take 20 mg by mouth daily.    Historical Provider, MD  cephALEXin (KEFLEX) 500 MG capsule Take 1 capsule (500 mg total) by mouth 3 (three) times daily. For 10 days 12/29/13   Tammy Triplett, PA-C  clindamycin (CLEOCIN) 300 MG capsule Take 1 capsule (300 mg total) by mouth 3 (three) times daily. 09/14/15   Burgess Amor, PA-C  cyclobenzaprine (FLEXERIL) 10 MG tablet Take 1 tablet (10 mg total) by mouth 2 (two) times daily as needed for muscle spasms. 11/08/14   Lenell Antu, MD  HYDROcodone-acetaminophen (HYCET) 7.5-325 mg/15 ml solution Take 10 mLs by mouth every 6 (six) hours as needed for moderate pain. 08/22/13   Tammy Triplett, PA-C  HYDROcodone-acetaminophen (HYCET) 7.5-325 mg/15 ml solution Take 10 mLs by mouth every 6 (six) hours as needed for moderate pain or severe pain. 09/05/13   Sunnie Nielsen, MD  HYDROcodone-acetaminophen (NORCO/VICODIN) 5-325 MG per tablet Take one-two tabs po q 4-6  hrs prn pain 12/29/13   Tammy Triplett, PA-C  ibuprofen (ADVIL,MOTRIN) 600 MG tablet Take 1 tablet (600 mg total) by mouth every 6 (six) hours as needed. 09/14/15   Burgess AmorJulie Taylen Osorto, PA-C  traZODone (DESYREL) 100 MG tablet Take 100 mg by mouth at bedtime.    Historical Provider, MD   BP 120/84 mmHg  Pulse 58  Temp(Src) 98.9 F (37.2 C) (Oral)  Resp 16  Ht 6\' 2"  (1.88 m)  Wt 97.07 kg  BMI 27.46 kg/m2  SpO2 98% Physical Exam  Constitutional: He is oriented to person, place, and time. He appears well-developed and well-nourished. No distress.  HENT:  Head: Normocephalic and atraumatic.  Right Ear: Tympanic membrane and external ear normal.   Left Ear: Tympanic membrane and external ear normal.  Mouth/Throat: Oropharynx is clear and moist and mucous membranes are normal. No oral lesions. No trismus in the jaw. Dental caries present.  Few dental caries and otherwise normal-appearing mouth with healthy-appearing dentition.  His left second premolar lower has a cavity on the posterior aspect of the tooth.  There is no obvious gingival edema, erythema.  No facial edema.  He has exquisite pain with even light touch of this tooth.  Also does not tolerate very well examination of cervical and submandibular lymph nodes.  No obvious adenopathy appreciated.  Eyes: Conjunctivae are normal.  Neck: Normal range of motion. Neck supple.  Cardiovascular: Normal rate and normal heart sounds.   Pulmonary/Chest: Effort normal.  Abdominal: He exhibits no distension.  Musculoskeletal: Normal range of motion.  Lymphadenopathy:    He has no cervical adenopathy.  Neurological: He is alert and oriented to person, place, and time.  Skin: Skin is warm and dry. No erythema.  Psychiatric: He has a normal mood and affect.    ED Course  Procedures (including critical care time) Labs Review Labs Reviewed - No data to display  Imaging Review No results found. I have personally reviewed and evaluated these images and lab results as part of my medical decision-making.   EKG Interpretation None      MDM   Final diagnoses:  Pain due to dental caries    Pain out of proportion to findings, several small cavities without no sign of mouth or dental infection.  With this degree of pain, suspect there may be a periapical abscess so he was placed on a one-week course of clindamycin.  Advised patient we cannot  prescribed narcotic pain medications for this complaint, anti-inflammatories may be more helpful since the narcotics he is using at home has not been helpful.  Ibuprofen prescribed.  Encourage follow-up with dental clinic as he has planned.    Burgess AmorJulie  Jenee Spaugh, PA-C 09/14/15 1001  Bethann BerkshireJoseph Zammit, MD 09/14/15 [phone]

## 2015-10-31 DIAGNOSIS — F319 Bipolar disorder, unspecified: Secondary | ICD-10-CM | POA: Diagnosis not present

## 2015-10-31 DIAGNOSIS — F17200 Nicotine dependence, unspecified, uncomplicated: Secondary | ICD-10-CM | POA: Diagnosis not present

## 2015-10-31 DIAGNOSIS — F909 Attention-deficit hyperactivity disorder, unspecified type: Secondary | ICD-10-CM | POA: Diagnosis not present

## 2015-11-14 ENCOUNTER — Emergency Department (HOSPITAL_COMMUNITY)
Admission: EM | Admit: 2015-11-14 | Discharge: 2015-11-14 | Disposition: A | Discharge status: Home / Self Care | Payer: Medicare Other | Attending: Dermatology | Admitting: Dermatology

## 2015-11-14 ENCOUNTER — Encounter (HOSPITAL_COMMUNITY): Payer: Self-pay | Admitting: *Deleted

## 2015-11-14 DIAGNOSIS — J45909 Unspecified asthma, uncomplicated: Secondary | ICD-10-CM | POA: Diagnosis not present

## 2015-11-14 DIAGNOSIS — Z5321 Procedure and treatment not carried out due to patient leaving prior to being seen by health care provider: Secondary | ICD-10-CM | POA: Diagnosis not present

## 2015-11-14 DIAGNOSIS — F17210 Nicotine dependence, cigarettes, uncomplicated: Secondary | ICD-10-CM | POA: Diagnosis not present

## 2015-11-14 DIAGNOSIS — T7611XA Adult physical abuse, suspected, initial encounter: Secondary | ICD-10-CM | POA: Diagnosis not present
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
## 2015-11-14 NOTE — ED Notes (Signed)
Pt seen walking by nursing desk asking which room his girlfriend is in. Room number given, pt can be heard stating to girlfriend that he is leaving. RN asked pt about leaving. Pt states " I aint staying here any longer" pt seen walking out by this RN and Sherran Needs.

## 2015-12-04 DIAGNOSIS — R739 Hyperglycemia, unspecified: Secondary | ICD-10-CM | POA: Diagnosis not present

## 2015-12-04 DIAGNOSIS — F319 Bipolar disorder, unspecified: Secondary | ICD-10-CM | POA: Diagnosis not present

## 2015-12-04 DIAGNOSIS — R197 Diarrhea, unspecified: Secondary | ICD-10-CM | POA: Diagnosis not present

## 2015-12-04 DIAGNOSIS — F17200 Nicotine dependence, unspecified, uncomplicated: Secondary | ICD-10-CM | POA: Diagnosis not present

## 2015-12-04 DIAGNOSIS — F329 Major depressive disorder, single episode, unspecified: Secondary | ICD-10-CM | POA: Diagnosis not present

## 2015-12-04 DIAGNOSIS — F909 Attention-deficit hyperactivity disorder, unspecified type: Secondary | ICD-10-CM | POA: Diagnosis not present

## 2015-12-04 DIAGNOSIS — Z0000 Encounter for general adult medical examination without abnormal findings: Secondary | ICD-10-CM | POA: Diagnosis not present

## 2015-12-04 DIAGNOSIS — R634 Abnormal weight loss: Secondary | ICD-10-CM | POA: Diagnosis not present

## 2015-12-04 DIAGNOSIS — E785 Hyperlipidemia, unspecified: Secondary | ICD-10-CM | POA: Diagnosis not present

## 2016-03-09 DIAGNOSIS — F319 Bipolar disorder, unspecified: Secondary | ICD-10-CM | POA: Diagnosis not present

## 2016-03-09 DIAGNOSIS — F909 Attention-deficit hyperactivity disorder, unspecified type: Secondary | ICD-10-CM | POA: Diagnosis not present
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
## 2016-09-06 ENCOUNTER — Emergency Department (HOSPITAL_COMMUNITY): Payer: Medicare Other

## 2016-09-06 ENCOUNTER — Encounter (HOSPITAL_COMMUNITY): Payer: Self-pay | Admitting: Emergency Medicine

## 2016-09-06 ENCOUNTER — Emergency Department (HOSPITAL_COMMUNITY)
Admission: EM | Admit: 2016-09-06 | Discharge: 2016-09-06 | Disposition: A | Discharge status: Home / Self Care | Payer: Medicare Other | Attending: Emergency Medicine | Admitting: Emergency Medicine

## 2016-09-06 DIAGNOSIS — S6991XA Unspecified injury of right wrist, hand and finger(s), initial encounter: Secondary | ICD-10-CM | POA: Insufficient documentation

## 2016-09-06 DIAGNOSIS — R001 Bradycardia, unspecified: Secondary | ICD-10-CM

## 2016-09-06 DIAGNOSIS — M79644 Pain in right finger(s): Secondary | ICD-10-CM | POA: Diagnosis not present

## 2016-09-06 DIAGNOSIS — W231XXA Caught, crushed, jammed, or pinched between stationary objects, initial encounter: Secondary | ICD-10-CM | POA: Diagnosis not present

## 2016-09-06 DIAGNOSIS — Y999 Unspecified external cause status: Secondary | ICD-10-CM | POA: Insufficient documentation

## 2016-09-06 DIAGNOSIS — M79641 Pain in right hand: Secondary | ICD-10-CM | POA: Diagnosis not present

## 2016-09-06 DIAGNOSIS — Y929 Unspecified place or not applicable: Secondary | ICD-10-CM | POA: Insufficient documentation

## 2016-09-06 DIAGNOSIS — M25531 Pain in right wrist: Secondary | ICD-10-CM | POA: Diagnosis not present

## 2016-09-06 DIAGNOSIS — F17210 Nicotine dependence, cigarettes, uncomplicated: Secondary | ICD-10-CM | POA: Insufficient documentation

## 2016-09-06 DIAGNOSIS — Y939 Activity, unspecified: Secondary | ICD-10-CM | POA: Insufficient documentation

## 2016-09-06 DIAGNOSIS — J45909 Unspecified asthma, uncomplicated: Secondary | ICD-10-CM | POA: Diagnosis not present

## 2016-09-06 DIAGNOSIS — Z79899 Other long term (current) drug therapy: Secondary | ICD-10-CM | POA: Insufficient documentation
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
## 2016-09-06 NOTE — ED Triage Notes (Signed)
Patient c/o injury to right middle finger. Patient states crushed between rim and mud tire while trying to change tire. Per patient split through right middle finger nail. Patient reports numbness in fingertip. No active bleeding at this time. Patient reports recently had tetanus vaccination.

## 2016-09-06 NOTE — Discharge Instructions (Signed)
Take Tylenol every 6 hours for ear pain. Apply ice or heat to the area for comfort. Wear the splint as needed. Follow up with the hand doctor as soon as he can for reevaluation. Follow-up with your primary care physician for evaluation of your slow heart rate.

## 2016-09-06 NOTE — ED Notes (Signed)
Finger splint applied to right middle finger 

## 2017-02-15 DIAGNOSIS — Z0000 Encounter for general adult medical examination without abnormal findings: Secondary | ICD-10-CM | POA: Diagnosis not present

## 2017-02-15 DIAGNOSIS — J410 Simple chronic bronchitis: Secondary | ICD-10-CM | POA: Diagnosis not present

## 2017-02-15 DIAGNOSIS — F17208 Nicotine dependence, unspecified, with other nicotine-induced disorders: Secondary | ICD-10-CM | POA: Diagnosis not present

## 2017-02-15 DIAGNOSIS — F3130 Bipolar disorder, current episode depressed, mild or moderate severity, unspecified: Secondary | ICD-10-CM | POA: Diagnosis not present

## 2017-02-15 DIAGNOSIS — Z1389 Encounter for screening for other disorder: Secondary | ICD-10-CM | POA: Diagnosis not present

## 2017-02-15 DIAGNOSIS — F909 Attention-deficit hyperactivity disorder, unspecified type: Secondary | ICD-10-CM | POA: Diagnosis not present

## 2017-02-15 DIAGNOSIS — F17200 Nicotine dependence, unspecified, uncomplicated: Secondary | ICD-10-CM | POA: Diagnosis not present

## 2017-02-15 DIAGNOSIS — N342 Other urethritis: Secondary | ICD-10-CM | POA: Diagnosis not present

## 2017-02-15 DIAGNOSIS — F17210 Nicotine dependence, cigarettes, uncomplicated: Secondary | ICD-10-CM | POA: Diagnosis not present

## 2017-02-15 DIAGNOSIS — R739 Hyperglycemia, unspecified: Secondary | ICD-10-CM | POA: Diagnosis not present

## 2017-02-15 DIAGNOSIS — E785 Hyperlipidemia, unspecified: Secondary | ICD-10-CM | POA: Diagnosis not present

## 2017-02-15 DIAGNOSIS — F329 Major depressive disorder, single episode, unspecified: Secondary | ICD-10-CM | POA: Diagnosis not present

## 2017-03-25 ENCOUNTER — Other Ambulatory Visit: Payer: Self-pay

## 2017-03-25 ENCOUNTER — Emergency Department (HOSPITAL_COMMUNITY): Payer: Medicare Other

## 2017-03-25 ENCOUNTER — Emergency Department (HOSPITAL_COMMUNITY)
Admission: EM | Admit: 2017-03-25 | Discharge: 2017-03-26 | Disposition: A | Discharge status: Home / Self Care | Payer: Medicare Other | Attending: Emergency Medicine | Admitting: Emergency Medicine

## 2017-03-25 DIAGNOSIS — R52 Pain, unspecified: Secondary | ICD-10-CM | POA: Diagnosis not present

## 2017-03-25 DIAGNOSIS — R05 Cough: Secondary | ICD-10-CM | POA: Diagnosis not present

## 2017-03-25 DIAGNOSIS — R0602 Shortness of breath: Secondary | ICD-10-CM | POA: Diagnosis not present

## 2017-03-25 DIAGNOSIS — X509XXA Other and unspecified overexertion or strenuous movements or postures, initial encounter: Secondary | ICD-10-CM | POA: Diagnosis not present

## 2017-03-25 DIAGNOSIS — Y998 Other external cause status: Secondary | ICD-10-CM | POA: Insufficient documentation

## 2017-03-25 DIAGNOSIS — M62838 Other muscle spasm: Secondary | ICD-10-CM | POA: Diagnosis not present

## 2017-03-25 DIAGNOSIS — S3992XA Unspecified injury of lower back, initial encounter: Secondary | ICD-10-CM | POA: Diagnosis not present

## 2017-03-25 DIAGNOSIS — R55 Syncope and collapse: Secondary | ICD-10-CM | POA: Diagnosis not present

## 2017-03-25 DIAGNOSIS — F17210 Nicotine dependence, cigarettes, uncomplicated: Secondary | ICD-10-CM | POA: Insufficient documentation

## 2017-03-25 DIAGNOSIS — S39012A Strain of muscle, fascia and tendon of lower back, initial encounter: Secondary | ICD-10-CM | POA: Diagnosis not present

## 2017-03-25 DIAGNOSIS — Y9389 Activity, other specified: Secondary | ICD-10-CM | POA: Diagnosis not present

## 2017-03-25 DIAGNOSIS — M545 Low back pain: Secondary | ICD-10-CM | POA: Diagnosis not present

## 2017-03-25 DIAGNOSIS — Z79899 Other long term (current) drug therapy: Secondary | ICD-10-CM | POA: Diagnosis not present

## 2017-03-25 DIAGNOSIS — M62830 Muscle spasm of back: Secondary | ICD-10-CM | POA: Insufficient documentation

## 2017-03-25 DIAGNOSIS — Y9289 Other specified places as the place of occurrence of the external cause: Secondary | ICD-10-CM | POA: Insufficient documentation

## 2017-03-25 DIAGNOSIS — J45909 Unspecified asthma, uncomplicated: Secondary | ICD-10-CM | POA: Insufficient documentation

## 2017-03-25 DIAGNOSIS — M546 Pain in thoracic spine: Secondary | ICD-10-CM | POA: Diagnosis not present

## 2017-03-25 MED ORDER — ONDANSETRON HCL 4 MG/2ML IJ SOLN
4.0000 mg | Freq: Once | INTRAMUSCULAR | Status: AC
  Administered 2017-03-26: 4 mg via INTRAVENOUS
  Filled 2017-03-25: qty 2

## 2017-03-25 MED ORDER — MORPHINE SULFATE (PF) 4 MG/ML IV SOLN
4.0000 mg | Freq: Once | INTRAVENOUS | Status: AC
Start: 2017-03-26 — End: 2017-03-26
  Administered 2017-03-26: 4 mg via INTRAVENOUS
  Filled 2017-03-25: qty 1

## 2017-03-25 NOTE — ED Triage Notes (Signed)
Pt reported to [facility] today he was changing out a transmission and engine earlier today and has been lifting very heavy objects. Chief complaint is extreme lower back pain. Pain has been constant for a couple weeks. Pt fell from a syncopal episode and awoke with sister standing by him.

## 2017-03-25 NOTE — ED Provider Notes (Signed)
[facility] EMERGENCY DEPARTMENT Provider Note   CSN: [id number] Arrival date & time: 03/25/17  2333     History   Chief Complaint Chief Complaint  Patient presents with  . Back Pain    HPI Gregory Cox is a 31 y.o. male.  Patient is a 31 year old male who presents to the emergency department with a complaint of low back pain and possibly passing out.  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  The patient states that over the last couple of days he has been doing more lifting, pushing, and straining.  He began to develop some back pain earlier during the day.  Later in the evening he went home to lay down.  But when he got up he" went to the floor".  He does not remember what happened after that, but was told by his family that he was in the floor and awakened with his family standing over him.  The patient states that he has pain from his lower back going to his buttocks and down into both legs.  He has tingling of his feet.  He feels as though his legs are weak.  Patient has not had any previous operations or procedures involving his back.  He says he has had an occasional muscle strain but no significant problem.  The patient denies any chest pain, palpitations, recent changes in his diet, or changes in medications.  He is not taken anything for his pain up to this point.      Past Medical History:  Diagnosis Date  . Acute kidney failure (HCC)   . Asthma   . Attention deficit disorder (ADD)   . Bipolar 1 disorder (HCC)   . Insomnia     Patient Active Problem List   Diagnosis Date Noted  . Ankle sprain 12/22/2012  . Complete rotator cuff tear of left shoulder 11/30/2012  . Cervicalgia 11/30/2012    Past Surgical History:  Procedure Laterality Date  . WISDOM TOOTH EXTRACTION         Home Medications    Prior to Admission medications   Medication Sig Start Date End Date Taking? Authorizing Provider    Alum & Mag Hydroxide-Simeth (MAGIC MOUTHWASH W/LIDOCAINE) SOLN Take 5 mLs by mouth 3 (three) times daily as needed for mouth pain. 09/05/13   Sunnie Nielsen, MD  ARIPiprazole (ABILIFY) 20 MG tablet Take 20 mg by mouth daily.    [provider]  cephALEXin (KEFLEX) 500 MG capsule Take 1 capsule (500 mg total) by mouth 3 (three) times daily. For 10 days 12/29/13   Triplett, Tammy, PA-C  clindamycin (CLEOCIN) 300 MG capsule Take 1 capsule (300 mg total) by mouth 3 (three) times daily. 09/14/15   Burgess Amor, PA-C  cyclobenzaprine (FLEXERIL) 10 MG tablet Take 1 tablet (10 mg total) by mouth 2 (two) times daily as needed for muscle spasms. 11/08/14   Lenell Antu, MD  HYDROcodone-acetaminophen (HYCET) 7.5-325 mg/15 ml solution Take 10 mLs by mouth every 6 (six) hours as needed for moderate pain. 08/22/13   Triplett, Tammy, PA-C  HYDROcodone-acetaminophen (HYCET) 7.5-325 mg/15 ml solution Take 10 mLs by mouth every 6 (six) hours as needed for moderate pain or severe pain. 09/05/13   Sunnie Nielsen, MD  HYDROcodone-acetaminophen (NORCO/VICODIN) 5-325 MG per tablet Take one-two tabs po q 4-6 hrs prn pain 12/29/13   Triplett, Tammy, PA-C  ibuprofen (ADVIL,MOTRIN) 600 MG tablet Take 1 tablet (600 mg total)  by mouth every 6 (six) hours as needed. 09/14/15   Burgess AmorIdol, Julie, PA-C  traZODone (DESYREL) 100 MG tablet Take 100 mg by mouth at bedtime.    [provider]    Family History Family History  Problem Relation Age of Onset  . Diabetes Mother   . Hypertension Mother   . Diabetes Other   . Hypertension Other   . Kidney disease Other   . Kidney disease Father     Social History Social History   Tobacco Use  . Smoking status: Current Every Day Smoker    Packs/day: 0.50    Years: 7.00    Pack years: 3.50    Types: Cigarettes  . Smokeless tobacco: Never Used  Substance Use Topics  . Alcohol use: No  . Drug use: No     Allergies   Fish allergy and Penicillins   Review of  Systems Review of Systems  Constitutional: Negative for activity change.       All ROS Neg except as noted in HPI  HENT: Negative for nosebleeds.   Eyes: Negative for photophobia and discharge.  Respiratory: Negative for cough, shortness of breath and wheezing.   Cardiovascular: Negative for chest pain and palpitations.  Gastrointestinal: Negative for abdominal pain and blood in stool.  Genitourinary: Negative for dysuria, frequency and hematuria.  Musculoskeletal: Positive for back pain. Negative for arthralgias and neck pain.  Skin: Negative.   Neurological: Positive for syncope and weakness. Negative for dizziness, seizures and speech difficulty.  Psychiatric/Behavioral: Negative for confusion and hallucinations.     Physical Exam Updated Vital Signs BP 114/60 (BP Location: Right Arm)   Pulse 63   Temp 98 F (36.7 C) (Oral)   Resp 18   Ht 6\' 2"  (1.88 m)   Wt 90.7 kg (200 lb)   SpO2 100%   BMI 25.68 kg/m   Physical Exam  Constitutional: He is oriented to person, place, and time. He appears well-developed and well-nourished.  Non-toxic appearance.  HENT:  Head: Normocephalic.  Right Ear: Tympanic membrane and external ear normal.  Left Ear: Tympanic membrane and external ear normal.  Eyes: EOM and lids are normal. Pupils are equal, round, and reactive to light.  Neck: Normal range of motion. Neck supple. Carotid bruit is not present.  Cardiovascular: Normal rate, regular rhythm, normal heart sounds, intact distal pulses and normal pulses.  Pulmonary/Chest: Breath sounds normal. No respiratory distress.  Abdominal: Soft. Bowel sounds are normal. There is no tenderness. There is no guarding.  Musculoskeletal:       Lumbar back: He exhibits decreased range of motion, pain and spasm.  Lymphadenopathy:       Head (right side): No submandibular adenopathy present.       Head (left side): No submandibular adenopathy present.    He has no cervical adenopathy.  Neurological: He  is alert and oriented to person, place, and time. He has normal strength. No cranial nerve deficit or sensory deficit.  There are no sensory deficits of the upper or lower extremities.  Patient states he is in too much pain to cooperate with motor exam.  There is no decrease in sensation of the saddle area.  Skin: Skin is warm and dry.  Psychiatric: He has a normal mood and affect. His speech is normal.  Nursing note and vitals reviewed.    ED Treatments / Results  Labs (all labs ordered are listed, but only abnormal results are displayed) Labs Reviewed - No data to display  EKG  EKG Interpretation None       Radiology No results found.  Procedures Procedures (including critical care time)  Medications Ordered in ED Medications - No data to display   Initial Impression / Assessment and Plan / ED Course  I have reviewed the triage vital signs and the nursing notes.  Pertinent labs & imaging results that were available during my care of the patient were reviewed by me and considered in my medical decision making (see chart for details).       Final Clinical Impressions(s) / ED Diagnoses MDM Patient states that he does a lot of heavy lifting at his job.  He works with transmissions and other heavy auto parts.  Today he had pain in his lower back accompanied by tingling and pain going into both legs from his back.  There was also a question of a syncopal episode according to the patient's family.  The electrocardiogram shows a normal sinus rhythm with no evidence of acute event.  The complete blood count is well within normal limits.  The pulse oximetry is 100% on room air.  Within normal limits by my interpretation.  Basic metabolic panel is well within normal limits.    Chest x-ray does not show any acute problem.  CT scan of the lumbar spine shows no acute fracture or dislocation.  There are disc bulges at the L4-L5 and L5-S1 area.  There is no high degree foraminal or  canal stenosis.  There is suspected left subarticular L4-L5 disc protrusion, that could cause some irritation of the nerve roots in the lateral recess.  I discussed the findings with the patient in terms of which he understands.  The patient's significant other was also in the room to hear the instructions as the patient was going in and out of sleep following receiving a muscle relaxer medication.  The patient will be treated with Flexeril, ibuprofen, and Ultram.  Patient is to follow-up with Dr Felecia ShellingFanta or Dr. Romeo AppleHarrison if any changes or problems.   Final diagnoses:  Strain of lumbar region, initial encounter  Vasovagal episode    ED Discharge Orders    None       Ivery QualeBryant, Latrease Kunde, PA-C 03/26/17 [id number]    Devoria AlbeKnapp, Iva, MD 03/26/17 [phone]

## 2017-03-26 ENCOUNTER — Emergency Department (HOSPITAL_COMMUNITY): Payer: Medicare Other

## 2017-03-26 ENCOUNTER — Other Ambulatory Visit (HOSPITAL_COMMUNITY): Payer: Self-pay | Admitting: Internal Medicine

## 2017-03-26 DIAGNOSIS — R300 Dysuria: Secondary | ICD-10-CM | POA: Diagnosis not present

## 2017-03-26 DIAGNOSIS — R05 Cough: Secondary | ICD-10-CM | POA: Diagnosis not present

## 2017-03-26 DIAGNOSIS — S3992XA Unspecified injury of lower back, initial encounter: Secondary | ICD-10-CM | POA: Diagnosis not present

## 2017-03-26 DIAGNOSIS — N390 Urinary tract infection, site not specified: Secondary | ICD-10-CM | POA: Diagnosis not present

## 2017-03-26 DIAGNOSIS — M5126 Other intervertebral disc displacement, lumbar region: Secondary | ICD-10-CM

## 2017-03-26 DIAGNOSIS — R0602 Shortness of breath: Secondary | ICD-10-CM | POA: Diagnosis not present

## 2017-03-26 DIAGNOSIS — S39012A Strain of muscle, fascia and tendon of lower back, initial encounter: Secondary | ICD-10-CM | POA: Diagnosis not present

## 2017-03-26 DIAGNOSIS — M545 Low back pain: Secondary | ICD-10-CM | POA: Diagnosis not present

## 2017-03-26 LAB — BASIC METABOLIC PANEL
Anion gap: 8 (ref 5–15)
BUN: 14 mg/dL (ref 6–20)
CO2: 28 mmol/L (ref 22–32)
Calcium: 9.5 mg/dL (ref 8.9–10.3)
Chloride: 102 mmol/L (ref 101–111)
Creatinine, Ser: 1.01 mg/dL (ref 0.61–1.24)
GFR calc Af Amer: >60 mL/min (ref >60)
GFR calc non Af Amer: >60 mL/min (ref >60)
Glucose, Bld: 94 mg/dL (ref 65–99)
Potassium: 3.8 mmol/L (ref 3.5–5.1)
Sodium: 138 mmol/L (ref 135–145)

## 2017-03-26 LAB — CBC WITH DIFFERENTIAL/PLATELET
Basophils Absolute: 0 10*3/uL (ref 0.0–0.1)
Basophils Relative: 0 %
Eosinophils Absolute: 0.1 10*3/uL (ref 0.0–0.7)
Eosinophils Relative: 2 %
HCT: 46.5 % (ref 39.0–52.0)
Hemoglobin: 14.6 g/dL (ref 13.0–17.0)
Lymphocytes Relative: 39 %
Lymphs Abs: 3 10*3/uL (ref 0.7–4.0)
MCH: 27.7 pg (ref 26.0–34.0)
MCHC: 31.4 g/dL (ref 30.0–36.0)
MCV: 88.2 fL (ref 78.0–100.0)
Monocytes Absolute: 0.4 10*3/uL (ref 0.1–1.0)
Monocytes Relative: 5 %
Neutro Abs: 4.2 10*3/uL (ref 1.7–7.7)
Neutrophils Relative %: 54 %
Platelets: 189 10*3/uL (ref 150–400)
RBC: 5.27 MIL/uL (ref 4.22–5.81)
RDW: 13.1 % (ref 11.5–15.5)
WBC: 7.8 10*3/uL (ref 4.0–10.5)

## 2017-03-26 MED ORDER — IBUPROFEN 600 MG PO TABS: 600.0000 mg | ORAL_TABLET | Freq: Four times a day (QID) | ORAL | 0 refills | Status: DC

## 2017-03-26 MED ORDER — CYCLOBENZAPRINE HCL 10 MG PO TABS: 10.0000 mg | ORAL_TABLET | Freq: Three times a day (TID) | ORAL | 0 refills | Status: DC

## 2017-03-26 MED ORDER — KETOROLAC TROMETHAMINE 30 MG/ML IJ SOLN
30.0000 mg | Freq: Once | INTRAMUSCULAR | Status: AC
  Administered 2017-03-26: 30 mg via INTRAVENOUS
  Filled 2017-03-26: qty 1

## 2017-03-26 MED ORDER — MIDAZOLAM HCL 2 MG/2ML IJ SOLN
1.5000 mg | Freq: Once | INTRAMUSCULAR | Status: AC
  Administered 2017-03-26: 1.5 mg via INTRAVENOUS
  Filled 2017-03-26: qty 2

## 2017-03-26 MED ORDER — TRAMADOL HCL 50 MG PO TABS: 50.0000 mg | ORAL_TABLET | Freq: Four times a day (QID) | ORAL | 0 refills | Status: DC | PRN

## 2017-03-26 NOTE — Discharge Instructions (Signed)
Your blacking out is probably related to your pain.  Your electrocardiogram, your electrolytes, and your vital signs are all well within normal limits.  The CT scan of your lumbar spine is negative for fracture, or dislocation.  There are some degenerative disc disease changes particularly at the L4-L5 area.  Please speak with Dr. Felecia ShellingFanta concerning this to see if you may need an orthopedic consultation for follow-up of your back.  Please use a heating pad to your back.  Please rest her back over the next few days.  Please use Flexeril 3 times daily, and ibuprofen with breakfast, lunch, dinner, and at bedtime.  May use Ultram for more severe pain.  Ultram and Flexeril may cause drowsiness.  Please do not drive, drink alcohol, operate machinery, or participate in activities requiring concentration when taking either of these medications.

## 2017-04-02 ENCOUNTER — Ambulatory Visit (HOSPITAL_COMMUNITY)
Admission: RE | Admit: 2017-04-02 | Discharge: 2017-04-02 | Disposition: A | Discharge status: Home / Self Care | Payer: Medicare Other | Source: Ambulatory Visit | Attending: Internal Medicine | Admitting: Internal Medicine

## 2017-04-02 DIAGNOSIS — M48061 Spinal stenosis, lumbar region without neurogenic claudication: Secondary | ICD-10-CM | POA: Insufficient documentation

## 2017-04-02 DIAGNOSIS — M5127 Other intervertebral disc displacement, lumbosacral region: Secondary | ICD-10-CM | POA: Insufficient documentation

## 2017-04-02 DIAGNOSIS — M545 Low back pain: Secondary | ICD-10-CM | POA: Diagnosis not present

## 2017-04-02 DIAGNOSIS — M5126 Other intervertebral disc displacement, lumbar region: Secondary | ICD-10-CM | POA: Diagnosis not present

## 2017-05-05 DIAGNOSIS — M5127 Other intervertebral disc displacement, lumbosacral region: Secondary | ICD-10-CM | POA: Diagnosis not present

## 2017-05-05 DIAGNOSIS — M4727 Other spondylosis with radiculopathy, lumbosacral region: Secondary | ICD-10-CM | POA: Diagnosis not present

## 2017-06-03 DIAGNOSIS — M5417 Radiculopathy, lumbosacral region: Secondary | ICD-10-CM | POA: Diagnosis not present

## 2017-06-03 DIAGNOSIS — M4727 Other spondylosis with radiculopathy, lumbosacral region: Secondary | ICD-10-CM | POA: Diagnosis not present

## 2017-07-01 DIAGNOSIS — M5127 Other intervertebral disc displacement, lumbosacral region: Secondary | ICD-10-CM | POA: Diagnosis not present

## 2017-07-21 DIAGNOSIS — M5127 Other intervertebral disc displacement, lumbosacral region: Secondary | ICD-10-CM | POA: Diagnosis not present

## 2017-07-21 DIAGNOSIS — M4727 Other spondylosis with radiculopathy, lumbosacral region: Secondary | ICD-10-CM | POA: Diagnosis not present

## 2017-08-09 DIAGNOSIS — M5127 Other intervertebral disc displacement, lumbosacral region: Secondary | ICD-10-CM | POA: Diagnosis not present

## 2017-08-16 DIAGNOSIS — M5126 Other intervertebral disc displacement, lumbar region: Secondary | ICD-10-CM | POA: Diagnosis not present

## 2017-09-17 DIAGNOSIS — M5127 Other intervertebral disc displacement, lumbosacral region: Secondary | ICD-10-CM | POA: Diagnosis not present

## 2017-09-17 DIAGNOSIS — R030 Elevated blood-pressure reading, without diagnosis of hypertension: Secondary | ICD-10-CM | POA: Diagnosis not present

## 2017-10-16 DIAGNOSIS — F329 Major depressive disorder, single episode, unspecified: Secondary | ICD-10-CM | POA: Diagnosis not present

## 2017-10-16 DIAGNOSIS — K047 Periapical abscess without sinus: Secondary | ICD-10-CM | POA: Diagnosis not present

## 2017-12-26 ENCOUNTER — Encounter (HOSPITAL_COMMUNITY): Payer: Self-pay

## 2017-12-26 ENCOUNTER — Emergency Department (HOSPITAL_COMMUNITY): Payer: Medicare Other

## 2017-12-26 ENCOUNTER — Emergency Department (HOSPITAL_COMMUNITY)
Admission: EM | Admit: 2017-12-26 | Discharge: 2017-12-26 | Disposition: A | Discharge status: Home / Self Care | Payer: Medicare Other | Attending: Emergency Medicine | Admitting: Emergency Medicine

## 2017-12-26 DIAGNOSIS — Z79899 Other long term (current) drug therapy: Secondary | ICD-10-CM | POA: Insufficient documentation

## 2017-12-26 DIAGNOSIS — M722 Plantar fascial fibromatosis: Secondary | ICD-10-CM | POA: Insufficient documentation

## 2017-12-26 DIAGNOSIS — M79671 Pain in right foot: Secondary | ICD-10-CM | POA: Diagnosis not present

## 2017-12-26 DIAGNOSIS — F17210 Nicotine dependence, cigarettes, uncomplicated: Secondary | ICD-10-CM | POA: Insufficient documentation

## 2017-12-26 DIAGNOSIS — J45909 Unspecified asthma, uncomplicated: Secondary | ICD-10-CM | POA: Diagnosis not present

## 2017-12-26 MED ORDER — DICLOFENAC SODIUM 75 MG PO TBEC: 75.0000 mg | DELAYED_RELEASE_TABLET | Freq: Two times a day (BID) | ORAL | 0 refills | Status: DC

## 2017-12-26 NOTE — ED Notes (Signed)
Pain to lateral surface to his R foot for several days  sometimes on plantar surface, others on top  Pain is worse when he presses on his foot  Has taken no OTC meds for discomfort  Ed: ibuprofen/tylenol for discomfort, need to intercede with pain early

## 2017-12-26 NOTE — Discharge Instructions (Signed)
Try rolling your foot over a cold can or bottle.  It is important that you wear shoes that support your feet well.  Call the podiatrist (foot doctor) listed to arrange a follow-up appointment in 1 week or so if not improving.

## 2017-12-26 NOTE — ED Triage Notes (Signed)
Pt reports severe right foot for 3 days and worsened last night. Difficulty putting pressure on foot. Denies injury

## 2017-12-28 NOTE — ED Provider Notes (Signed)
[facility] EMERGENCY DEPARTMENT Provider Note   CSN: [id number] Arrival date & time: 12/26/17  1111     History   Chief Complaint Chief Complaint  Patient presents with  . Foot Pain    HPI Gregory Cox is a 32 y.o. male.  HPI   Gregory Cox is a 32 y.o. male who presents to the Emergency Department complaining of right foot pain for 3 days.  Pain worse on the evening prior to arrival.  He describes a sharp throbbing pain to the lateral foot and pain worsened with any attempted weightbearing.  He denies known injury, but admits to wearing sandals frequently.  He also states pain is worse in the mornings and improves somewhat throughout the day.  He denies numbness of his foot, pain to the ankle, swelling, or discoloration of the foot.  No history of gout.   Past Medical History:  Diagnosis Date  . Acute kidney failure (HCC)   . Asthma   . Attention deficit disorder (ADD)   . Bipolar 1 disorder (HCC)   . Insomnia     Patient Active Problem List   Diagnosis Date Noted  . Ankle sprain 12/22/2012  . Complete rotator cuff tear of left shoulder 11/30/2012  . Cervicalgia 11/30/2012    Past Surgical History:  Procedure Laterality Date  . WISDOM TOOTH EXTRACTION          Home Medications    Prior to Admission medications   Medication Sig Start Date End Date Taking? Authorizing Provider  Alum & Mag Hydroxide-Simeth (MAGIC MOUTHWASH W/LIDOCAINE) SOLN Take 5 mLs by mouth 3 (three) times daily as needed for mouth pain. 09/05/13   Sunnie Nielsen, MD  ARIPiprazole (ABILIFY) 20 MG tablet Take 20 mg by mouth daily.    [provider]  cephALEXin (KEFLEX) 500 MG capsule Take 1 capsule (500 mg total) by mouth 3 (three) times daily. For 10 days 12/29/13   Andrey Mccaskill, PA-C  clindamycin (CLEOCIN) 300 MG capsule Take 1 capsule (300 mg total) by mouth 3 (three) times daily. 09/14/15   Burgess Amor, PA-C  cyclobenzaprine (FLEXERIL) 10 MG tablet Take 1 tablet (10 mg total) by  mouth 3 (three) times daily. 03/26/17   Ivery Quale, PA-C  diclofenac (VOLTAREN) 75 MG EC tablet Take 1 tablet (75 mg total) by mouth 2 (two) times daily. Take with food 12/26/17   Milee Qualls, PA-C  HYDROcodone-acetaminophen (HYCET) 7.5-325 mg/15 ml solution Take 10 mLs by mouth every 6 (six) hours as needed for moderate pain. 08/22/13   Matisyn Cabeza, PA-C  HYDROcodone-acetaminophen (HYCET) 7.5-325 mg/15 ml solution Take 10 mLs by mouth every 6 (six) hours as needed for moderate pain or severe pain. 09/05/13   Sunnie Nielsen, MD  HYDROcodone-acetaminophen (NORCO/VICODIN) 5-325 MG per tablet Take one-two tabs po q 4-6 hrs prn pain 12/29/13   Manual Navarra, PA-C  ibuprofen (ADVIL,MOTRIN) 600 MG tablet Take 1 tablet (600 mg total) by mouth 4 (four) times daily. 03/26/17   Ivery Quale, PA-C  traMADol (ULTRAM) 50 MG tablet Take 1 tablet (50 mg total) by mouth every 6 (six) hours as needed. 03/26/17   Ivery Quale, PA-C  traZODone (DESYREL) 100 MG tablet Take 100 mg by mouth at bedtime.    [provider]    Family History Family History  Problem Relation Age of Onset  . Diabetes Mother   . Hypertension Mother   . Diabetes Other   . Hypertension Other   . Kidney disease Other   .  Kidney disease Father     Social History Social History   Tobacco Use  . Smoking status: Current Every Day Smoker    Packs/day: 0.50    Years: 7.00    Pack years: 3.50    Types: Cigarettes  . Smokeless tobacco: Never Used  Substance Use Topics  . Alcohol use: Not Currently  . Drug use: No     Allergies   Fish allergy and Penicillins   Review of Systems Review of Systems  Constitutional: Negative for chills and fever.  Respiratory: Negative for shortness of breath.   Cardiovascular: Negative for chest pain.  Musculoskeletal: Positive for arthralgias (Right foot pain). Negative for joint swelling.  Skin: Negative for color change and wound.  Neurological: Negative for weakness and  numbness.     Physical Exam Updated Vital Signs BP 128/72 (BP Location: Right Arm)   Pulse (!) 52   Temp 98.4 F (36.9 C) (Oral)   Resp 16   Ht 6\' 2"  (1.88 m)   Wt 90.7 kg   SpO2 100%   BMI 25.67 kg/m   Physical Exam  Constitutional: He appears well-developed and well-nourished. No distress.  HENT:  Head: Normocephalic and atraumatic.  Cardiovascular: Normal rate, regular rhythm and intact distal pulses.  Pulmonary/Chest: Effort normal and breath sounds normal.  Musculoskeletal: He exhibits tenderness. He exhibits no edema or deformity.  Focal tenderness of the lateral right foot.  No bony deformity.  No edema or skin changes.  Toes are nontender.  No open wound.  Neurological: He is alert. No sensory deficit.  Skin: Skin is warm. Capillary refill takes less than 2 seconds. No rash noted.  Nursing note and vitals reviewed.    ED Treatments / Results  Labs (all labs ordered are listed, but only abnormal results are displayed) Labs Reviewed - No data to display  EKG None  Radiology No results found.  Procedures Procedures (including critical care time)  Medications Ordered in ED Medications - No data to display   Initial Impression / Assessment and Plan / ED Course  I have reviewed the triage vital signs and the nursing notes.  Pertinent labs & imaging results that were available during my care of the patient were reviewed by me and considered in my medical decision making (see chart for details).     Patient with focal tenderness of the lateral foot.  No skin changes or open wounds.  No concerning symptoms for infectious process.  Admits to wearing sandals, I feel symptoms are likely inflammatory and related to plantar fasciitis.  Patient agrees to treatment plan and referral information provided for local podiatry.  Final Clinical Impressions(s) / ED Diagnoses   Final diagnoses:  Plantar fasciitis of right foot    ED Discharge Orders         Ordered     diclofenac (VOLTAREN) 75 MG EC tablet  2 times daily     12/26/17 1320           Dineen Conradt, [place], PA-C 12/28/17 1610    Samuel Jester, DO 12/28/17 1655

## 2018-04-06 DIAGNOSIS — F909 Attention-deficit hyperactivity disorder, unspecified type: Secondary | ICD-10-CM | POA: Diagnosis not present

## 2018-04-06 DIAGNOSIS — R300 Dysuria: Secondary | ICD-10-CM | POA: Diagnosis not present

## 2018-04-06 DIAGNOSIS — Z1331 Encounter for screening for depression: Secondary | ICD-10-CM | POA: Diagnosis not present

## 2018-04-06 DIAGNOSIS — E785 Hyperlipidemia, unspecified: Secondary | ICD-10-CM | POA: Diagnosis not present

## 2018-04-06 DIAGNOSIS — F329 Major depressive disorder, single episode, unspecified: Secondary | ICD-10-CM | POA: Diagnosis not present

## 2018-04-06 DIAGNOSIS — R739 Hyperglycemia, unspecified: Secondary | ICD-10-CM | POA: Diagnosis not present

## 2018-04-06 DIAGNOSIS — Z0000 Encounter for general adult medical examination without abnormal findings: Secondary | ICD-10-CM | POA: Diagnosis not present

## 2018-04-06 DIAGNOSIS — F3130 Bipolar disorder, current episode depressed, mild or moderate severity, unspecified: Secondary | ICD-10-CM | POA: Diagnosis not present

## 2018-04-06 DIAGNOSIS — Z1389 Encounter for screening for other disorder: Secondary | ICD-10-CM | POA: Diagnosis not present

## 2018-04-06 DIAGNOSIS — F17200 Nicotine dependence, unspecified, uncomplicated: Secondary | ICD-10-CM | POA: Diagnosis not present

## 2018-04-06 DIAGNOSIS — F17208 Nicotine dependence, unspecified, with other nicotine-induced disorders: Secondary | ICD-10-CM | POA: Diagnosis not present

## 2018-04-06 DIAGNOSIS — N342 Other urethritis: Secondary | ICD-10-CM | POA: Diagnosis not present

## 2018-04-06 DIAGNOSIS — F17210 Nicotine dependence, cigarettes, uncomplicated: Secondary | ICD-10-CM | POA: Diagnosis not present

## 2018-04-06 DIAGNOSIS — J410 Simple chronic bronchitis: Secondary | ICD-10-CM | POA: Diagnosis not present

## 2018-04-14 ENCOUNTER — Other Ambulatory Visit: Payer: Self-pay

## 2018-04-14 ENCOUNTER — Emergency Department (HOSPITAL_COMMUNITY): Payer: Medicare Other

## 2018-04-14 ENCOUNTER — Encounter (HOSPITAL_COMMUNITY): Payer: Self-pay | Admitting: *Deleted

## 2018-04-14 ENCOUNTER — Emergency Department (HOSPITAL_COMMUNITY)
Admission: EM | Admit: 2018-04-14 | Discharge: 2018-04-15 | Disposition: A | Discharge status: Home / Self Care | Payer: Medicare Other | Attending: Emergency Medicine | Admitting: Emergency Medicine

## 2018-04-14 DIAGNOSIS — Z79899 Other long term (current) drug therapy: Secondary | ICD-10-CM | POA: Insufficient documentation

## 2018-04-14 DIAGNOSIS — R079 Chest pain, unspecified: Secondary | ICD-10-CM | POA: Diagnosis not present

## 2018-04-14 DIAGNOSIS — R0789 Other chest pain: Secondary | ICD-10-CM | POA: Diagnosis not present

## 2018-04-14 DIAGNOSIS — F17210 Nicotine dependence, cigarettes, uncomplicated: Secondary | ICD-10-CM | POA: Diagnosis not present

## 2018-04-14 DIAGNOSIS — R001 Bradycardia, unspecified: Secondary | ICD-10-CM | POA: Diagnosis not present

## 2018-04-14 DIAGNOSIS — J45909 Unspecified asthma, uncomplicated: Secondary | ICD-10-CM | POA: Insufficient documentation

## 2018-04-14 LAB — CBC
HCT: 46.3 % (ref 39.0–52.0)
Hemoglobin: 14.5 g/dL (ref 13.0–17.0)
MCH: 28.2 pg (ref 26.0–34.0)
MCHC: 31.3 g/dL (ref 30.0–36.0)
MCV: 89.9 fL (ref 80.0–100.0)
Platelets: 197 10*3/uL (ref 150–400)
RBC: 5.15 MIL/uL (ref 4.22–5.81)
RDW: 12.4 % (ref 11.5–15.5)
WBC: 6.6 10*3/uL (ref 4.0–10.5)
nRBC: 0 % (ref 0.0–0.2)

## 2018-04-14 LAB — BASIC METABOLIC PANEL
Anion gap: 8 (ref 5–15)
BUN: 13 mg/dL (ref 6–20)
CO2: 25 mmol/L (ref 22–32)
Calcium: 9.1 mg/dL (ref 8.9–10.3)
Chloride: 106 mmol/L (ref 98–111)
Creatinine, Ser: 0.95 mg/dL (ref 0.61–1.24)
GFR calc Af Amer: >60 mL/min (ref >60)
GFR calc non Af Amer: >60 mL/min (ref >60)
Glucose, Bld: 98 mg/dL (ref 70–99)
Potassium: 3.6 mmol/L (ref 3.5–5.1)
Sodium: 139 mmol/L (ref 135–145)

## 2018-04-14 LAB — POCT I-STAT TROPONIN I: Troponin i, poc: 0 ng/mL (ref 0.00–0.08)

## 2018-04-14 MED ORDER — KETOROLAC TROMETHAMINE 30 MG/ML IJ SOLN
30.0000 mg | Freq: Once | INTRAMUSCULAR | Status: AC
  Administered 2018-04-14: 30 mg via INTRAMUSCULAR
  Filled 2018-04-14: qty 1

## 2018-04-14 NOTE — ED Triage Notes (Signed)
Pt c/o chest pain that started x one day ago and has gotten progressively worse; pt states if he stretches it makes the pain worse

## 2018-04-15 MED ORDER — PREDNISONE 20 MG PO TABS: 40.0000 mg | ORAL_TABLET | Freq: Every day | ORAL | 0 refills | Status: DC

## 2018-04-15 NOTE — ED Provider Notes (Signed)
[facility] EMERGENCY DEPARTMENT Provider Note   CSN: [id number] Arrival date & time: 04/14/18  2205     History   Chief Complaint Chief Complaint  Patient presents with  . Chest Pain    HPI Gregory Cox is a 32 y.o. male.  HPI Patient presents with chest pain.  Sharp on the left side of his chest.  Has had for the last couple days dull but much more severe today.  States that happened while he was laying on the couch.  Worse with movements.  Worse with moving his arm in certain position.  Worse with pressure on his chest.  No shortness of breath.  No cough.  No fevers.  Has not had pains of this before.  No flank pain. Past Medical History:  Diagnosis Date  . Acute kidney failure (HCC)   . Asthma   . Attention deficit disorder (ADD)   . Bipolar 1 disorder (HCC)   . Insomnia     Patient Active Problem List   Diagnosis Date Noted  . Ankle sprain 12/22/2012  . Complete rotator cuff tear of left shoulder 11/30/2012  . Cervicalgia 11/30/2012    Past Surgical History:  Procedure Laterality Date  . WISDOM TOOTH EXTRACTION          Home Medications    Prior to Admission medications   Medication Sig Start Date End Date Taking? Authorizing Provider  Alum & Mag Hydroxide-Simeth (MAGIC MOUTHWASH W/LIDOCAINE) SOLN Take 5 mLs by mouth 3 (three) times daily as needed for mouth pain. 09/05/13   Sunnie Nielsenpitz, Brian, MD  ARIPiprazole (ABILIFY) 20 MG tablet Take 20 mg by mouth daily.    [provider]  cephALEXin (KEFLEX) 500 MG capsule Take 1 capsule (500 mg total) by mouth 3 (three) times daily. For 10 days 12/29/13   Triplett, Tammy, PA-C  clindamycin (CLEOCIN) 300 MG capsule Take 1 capsule (300 mg total) by mouth 3 (three) times daily. 09/14/15   Burgess AmorIdol, Julie, PA-C  cyclobenzaprine (FLEXERIL) 10 MG tablet Take 1 tablet (10 mg total) by mouth 3 (three) times daily. 03/26/17   Ivery QualeBryant, Hobson, PA-C  diclofenac (VOLTAREN) 75 MG EC tablet Take 1 tablet (75 mg total) by mouth 2 (two)  times daily. Take with food 12/26/17   Triplett, Tammy, PA-C  HYDROcodone-acetaminophen (HYCET) 7.5-325 mg/15 ml solution Take 10 mLs by mouth every 6 (six) hours as needed for moderate pain. 08/22/13   Triplett, Tammy, PA-C  HYDROcodone-acetaminophen (HYCET) 7.5-325 mg/15 ml solution Take 10 mLs by mouth every 6 (six) hours as needed for moderate pain or severe pain. 09/05/13   Sunnie Nielsenpitz, Brian, MD  HYDROcodone-acetaminophen (NORCO/VICODIN) 5-325 MG per tablet Take one-two tabs po q 4-6 hrs prn pain 12/29/13   Triplett, Tammy, PA-C  ibuprofen (ADVIL,MOTRIN) 600 MG tablet Take 1 tablet (600 mg total) by mouth 4 (four) times daily. 03/26/17   Ivery QualeBryant, Hobson, PA-C  predniSONE (DELTASONE) 20 MG tablet Take 2 tablets (40 mg total) by mouth daily. 04/15/18   Benjiman CorePickering, Fama Muenchow, MD  traMADol (ULTRAM) 50 MG tablet Take 1 tablet (50 mg total) by mouth every 6 (six) hours as needed. 03/26/17   Ivery QualeBryant, Hobson, PA-C  traZODone (DESYREL) 100 MG tablet Take 100 mg by mouth at bedtime.    [provider]    Family History Family History  Problem Relation Age of Onset  . Diabetes Mother   . Hypertension Mother   . Diabetes Other   . Hypertension Other   . Kidney disease Other   .  Kidney disease Father     Social History Social History   Tobacco Use  . Smoking status: Current Every Day Smoker    Packs/day: 0.50    Years: 7.00    Pack years: 3.50    Types: Cigarettes  . Smokeless tobacco: Never Used  Substance Use Topics  . Alcohol use: Not Currently  . Drug use: No     Allergies   Fish allergy and Penicillins   Review of Systems Review of Systems  Constitutional: Negative for appetite change.  HENT: Negative for congestion.   Respiratory: Negative for shortness of breath.   Cardiovascular: Positive for chest pain. Negative for leg swelling.  Gastrointestinal: Negative for abdominal pain.  Genitourinary: Negative for flank pain.  Musculoskeletal: Positive for back pain.       Chronic  left-sided low back pain.  Skin: Negative for rash.  Neurological: Negative for weakness and numbness.     Physical Exam Updated Vital Signs BP 103/61   Pulse (!) 49   Temp 97.8 F (36.6 C) (Oral)   Resp 16   Ht 6\' 2"  (1.88 m)   Wt 91.6 kg   SpO2 95%   BMI 25.94 kg/m   Physical Exam HENT:     Head: Atraumatic.  Cardiovascular:     Rate and Rhythm: Regular rhythm. Bradycardia present.  No extrasystoles are present.    Heart sounds: Heart sounds not distant. No murmur.  Pulmonary:     Effort: No tachypnea.     Breath sounds: No stridor.     Comments: Tenderness on left parasternal area.  No rash. Abdominal:     Palpations: Abdomen is soft.  Musculoskeletal:     Right lower leg: He exhibits no tenderness. No edema.     Left lower leg: He exhibits no tenderness. No edema.  Skin:    Capillary Refill: Capillary refill takes less than 2 seconds.  Neurological:     General: No focal deficit present.     Mental Status: He is alert.      ED Treatments / Results  Labs (all labs ordered are listed, but only abnormal results are displayed) Labs Reviewed  BASIC METABOLIC PANEL  CBC  I-STAT TROPONIN, ED  POCT I-STAT TROPONIN I    EKG EKG Interpretation  Date/Time:  Thursday April 14 2018 23:22:14 EST Ventricular Rate:  46 PR Interval:    QRS Duration: 99 QT Interval:  414 QTC Calculation: 362 R Axis:   94 Text Interpretation:  Sinus bradycardia Borderline right axis deviation Confirmed by Benjiman Core [phone]) on 04/14/2018 11:58:55 PM   Radiology Dg Chest 2 View  Result Date: 04/14/2018 CLINICAL DATA:  Chest pain for 1 day EXAM: CHEST - 2 VIEW COMPARISON:  03/26/2017 FINDINGS: Cardiac shadow is within normal limits. Elevation of the right hemidiaphragm is noted new from the prior study. This may be related to poor inspiratory effort as it is not well appreciated on the lateral projection. No focal infiltrate or sizable effusion is seen. No bony  abnormality is noted. IMPRESSION: No acute abnormality seen. Electronically Signed   By: Alcide Clever M.D.   On: 04/14/2018 22:57    Procedures Procedures (including critical care time)  Medications Ordered in ED Medications  ketorolac (TORADOL) 30 MG/ML injection 30 mg (30 mg Intramuscular Given 04/14/18 2324)     Initial Impression / Assessment and Plan / ED Course  I have reviewed the triage vital signs and the nursing notes.  Pertinent labs & imaging results that  were available during my care of the patient were reviewed by me and considered in my medical decision making (see chart for details).     Left-sided parasternal chest pain with tenderness.  I think it is most likely musculoskeletal pain.  EKG and x-ray reassuring.  Blood work reassuring.  Given Toradol here with some relief of pain.  Discharged home with prednisone for anti-inflammatory effect.  Final Clinical Impressions(s) / ED Diagnoses   Final diagnoses:  Chest wall pain    ED Discharge Orders         Ordered    predniSONE (DELTASONE) 20 MG tablet  Daily     04/15/18 0008           Benjiman CorePickering, Shariq Puig, MD 04/15/18 0021

## 2018-04-15 NOTE — Discharge Instructions (Addendum)
Motrin and Tylenol may also help with the pain.  Follow-up with your doctor as needed.

## 2018-06-09 DIAGNOSIS — A599 Trichomoniasis, unspecified: Secondary | ICD-10-CM | POA: Diagnosis not present

## 2018-06-17 ENCOUNTER — Other Ambulatory Visit: Payer: Self-pay

## 2018-06-17 ENCOUNTER — Emergency Department (HOSPITAL_COMMUNITY)
Admission: EM | Admit: 2018-06-17 | Discharge: 2018-06-18 | Disposition: A | Discharge status: Home / Self Care | Payer: Medicare Other | Attending: Emergency Medicine | Admitting: Emergency Medicine

## 2018-06-17 ENCOUNTER — Encounter (HOSPITAL_COMMUNITY): Payer: Self-pay | Admitting: Emergency Medicine

## 2018-06-17 DIAGNOSIS — R112 Nausea with vomiting, unspecified: Secondary | ICD-10-CM

## 2018-06-17 DIAGNOSIS — Z79899 Other long term (current) drug therapy: Secondary | ICD-10-CM | POA: Insufficient documentation

## 2018-06-17 DIAGNOSIS — F17210 Nicotine dependence, cigarettes, uncomplicated: Secondary | ICD-10-CM | POA: Insufficient documentation

## 2018-06-17 DIAGNOSIS — F909 Attention-deficit hyperactivity disorder, unspecified type: Secondary | ICD-10-CM | POA: Insufficient documentation

## 2018-06-17 DIAGNOSIS — R1110 Vomiting, unspecified: Secondary | ICD-10-CM | POA: Diagnosis present

## 2018-06-17 DIAGNOSIS — J45909 Unspecified asthma, uncomplicated: Secondary | ICD-10-CM | POA: Diagnosis not present

## 2018-06-17 DIAGNOSIS — R1084 Generalized abdominal pain: Secondary | ICD-10-CM | POA: Diagnosis not present

## 2018-06-17 DIAGNOSIS — B349 Viral infection, unspecified: Secondary | ICD-10-CM

## 2018-06-17 DIAGNOSIS — R05 Cough: Secondary | ICD-10-CM | POA: Diagnosis not present

## 2018-06-17 LAB — CBC
HCT: 50.3 % (ref 39.0–52.0)
Hemoglobin: 15.3 g/dL (ref 13.0–17.0)
MCH: 27.6 pg (ref 26.0–34.0)
MCHC: 30.4 g/dL (ref 30.0–36.0)
MCV: 90.8 fL (ref 80.0–100.0)
Platelets: 171 10*3/uL (ref 150–400)
RBC: 5.54 MIL/uL (ref 4.22–5.81)
RDW: 13.1 % (ref 11.5–15.5)
WBC: 9.4 10*3/uL (ref 4.0–10.5)
nRBC: 0 % (ref 0.0–0.2)

## 2018-06-17 LAB — COMPREHENSIVE METABOLIC PANEL
ALT: 65 U/L — ABNORMAL HIGH (ref 0–44)
AST: 50 U/L — ABNORMAL HIGH (ref 15–41)
Albumin: 4.8 g/dL (ref 3.5–5.0)
Alkaline Phosphatase: 88 U/L (ref 38–126)
Anion gap: 7 (ref 5–15)
BUN: 13 mg/dL (ref 6–20)
CO2: 26 mmol/L (ref 22–32)
Calcium: 9.1 mg/dL (ref 8.9–10.3)
Chloride: 106 mmol/L (ref 98–111)
Creatinine, Ser: 0.87 mg/dL (ref 0.61–1.24)
GFR calc Af Amer: >60 mL/min (ref >60)
GFR calc non Af Amer: >60 mL/min (ref >60)
Glucose, Bld: 98 mg/dL (ref 70–99)
Potassium: 3.9 mmol/L (ref 3.5–5.1)
Sodium: 139 mmol/L (ref 135–145)
Total Bilirubin: 0.7 mg/dL (ref 0.3–1.2)
Total Protein: 7.9 g/dL (ref 6.5–8.1)

## 2018-06-17 LAB — LIPASE, BLOOD: Lipase: 32 U/L (ref 11–51)

## 2018-06-17 MED ORDER — SODIUM CHLORIDE 0.9% FLUSH: 3.0000 mL | Freq: Once | INTRAVENOUS | Status: DC

## 2018-06-17 NOTE — ED Triage Notes (Signed)
Patient vomiting since early this am.

## 2018-06-17 NOTE — ED Notes (Signed)
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

## 2018-06-18 ENCOUNTER — Emergency Department (HOSPITAL_COMMUNITY): Payer: Medicare Other

## 2018-06-18 DIAGNOSIS — R1110 Vomiting, unspecified: Secondary | ICD-10-CM | POA: Diagnosis not present

## 2018-06-18 DIAGNOSIS — R112 Nausea with vomiting, unspecified: Secondary | ICD-10-CM | POA: Diagnosis not present

## 2018-06-18 DIAGNOSIS — R05 Cough: Secondary | ICD-10-CM | POA: Diagnosis not present

## 2018-06-18 MED ORDER — ONDANSETRON HCL 4 MG/2ML IJ SOLN
4.0000 mg | Freq: Once | INTRAMUSCULAR | Status: AC
  Administered 2018-06-18: 4 mg via INTRAVENOUS
  Filled 2018-06-18: qty 2

## 2018-06-18 MED ORDER — HYDROCODONE-ACETAMINOPHEN 5-325 MG PO TABS: 1.0000 | ORAL_TABLET | ORAL | 0 refills | Status: DC | PRN

## 2018-06-18 MED ORDER — MORPHINE SULFATE (PF) 4 MG/ML IV SOLN
4.0000 mg | Freq: Once | INTRAVENOUS | Status: AC
  Administered 2018-06-18: 4 mg via INTRAVENOUS
  Filled 2018-06-18: qty 1

## 2018-06-18 MED ORDER — ONDANSETRON 4 MG PO TBDP: 4.0000 mg | ORAL_TABLET | Freq: Three times a day (TID) | ORAL | 0 refills | Status: DC | PRN

## 2018-06-18 MED ORDER — IOHEXOL 300 MG/ML  SOLN
100.0000 mL | Freq: Once | INTRAMUSCULAR | Status: AC | PRN
  Administered 2018-06-18: 100 mL via INTRAVENOUS

## 2018-06-18 NOTE — Discharge Instructions (Addendum)
Rest to make sure you are drinking plenty of fluids.  Continue using the Zofran medication if needed for return of your nausea.  You have been prescribed a small quantity of pain medication, use caution with this as it will make you drowsy, do not drive within 4 hours of taking this medication.  Also use it sparingly as you need to recognize if your symptoms are getting worse.  Plan to see your doctor for recheck early next week if your symptoms persist.

## 2018-06-18 NOTE — ED Provider Notes (Signed)
[facility] EMERGENCY DEPARTMENT Provider Note   CSN: [id number] Arrival date & time: 06/17/18  1655    History   Chief Complaint Chief Complaint  Patient presents with  . Emesis    HPI Gregory Cox is a 33 y.o. male with a history of asthma, ADD and bipolar disorder presenting with a 1 day history of nausea vomiting and generalized abdominal pain.  He describes 7-8 episodes of nonbloody vomiting since he woke early this morning.  He denies diarrhea.  He has had intermittent chills and hot flashes, suspects he has been running a fever.  Additionally reports a nonproductive cough present for the past several days, denies sob, cp, sore throat, nasal congestion or rhinorrhea. He does endorse generalized body aches and fatigue. He has been exposed to several family members with similar symptoms this past week, particularly his son.  He has had no treatment prior to arrival.  Denies any recent antibiotic use, no recent travel.  Last BM was yesterday and normal.       The history is provided by the patient and a parent.    Past Medical History:  Diagnosis Date  . Acute kidney failure (HCC)   . Asthma   . Attention deficit disorder (ADD)   . Bipolar 1 disorder (HCC)   . Insomnia     Patient Active Problem List   Diagnosis Date Noted  . Ankle sprain 12/22/2012  . Complete rotator cuff tear of left shoulder 11/30/2012  . Cervicalgia 11/30/2012    Past Surgical History:  Procedure Laterality Date  . WISDOM TOOTH EXTRACTION          Home Medications    Prior to Admission medications   Medication Sig Start Date End Date Taking? Authorizing Provider  Alum & Mag Hydroxide-Simeth (MAGIC MOUTHWASH W/LIDOCAINE) SOLN Take 5 mLs by mouth 3 (three) times daily as needed for mouth pain. 09/05/13   Sunnie Nielsen, MD  ARIPiprazole (ABILIFY) 20 MG tablet Take 20 mg by mouth daily.    [provider]  cephALEXin (KEFLEX) 500 MG capsule Take 1 capsule (500 mg total) by mouth 3  (three) times daily. For 10 days 12/29/13   Triplett, Tammy, PA-C  clindamycin (CLEOCIN) 300 MG capsule Take 1 capsule (300 mg total) by mouth 3 (three) times daily. 09/14/15   Burgess Amor, PA-C  cyclobenzaprine (FLEXERIL) 10 MG tablet Take 1 tablet (10 mg total) by mouth 3 (three) times daily. 03/26/17   Ivery Quale, PA-C  diclofenac (VOLTAREN) 75 MG EC tablet Take 1 tablet (75 mg total) by mouth 2 (two) times daily. Take with food 12/26/17   Triplett, Tammy, PA-C  HYDROcodone-acetaminophen (NORCO/VICODIN) 5-325 MG tablet Take 1 tablet by mouth every 4 (four) hours as needed. 06/18/18   Burgess Amor, PA-C  ibuprofen (ADVIL,MOTRIN) 600 MG tablet Take 1 tablet (600 mg total) by mouth 4 (four) times daily. 03/26/17   Ivery Quale, PA-C  ondansetron (ZOFRAN ODT) 4 MG disintegrating tablet Take 1 tablet (4 mg total) by mouth every 8 (eight) hours as needed for nausea or vomiting. 06/18/18   Quanetta Truss, Raynelle Fanning, PA-C  predniSONE (DELTASONE) 20 MG tablet Take 2 tablets (40 mg total) by mouth daily. 04/15/18   Benjiman Core, MD  traMADol (ULTRAM) 50 MG tablet Take 1 tablet (50 mg total) by mouth every 6 (six) hours as needed. 03/26/17   Ivery Quale, PA-C  traZODone (DESYREL) 100 MG tablet Take 100 mg by mouth at bedtime.    [provider]  Family History Family History  Problem Relation Age of Onset  . Diabetes Mother   . Hypertension Mother   . Diabetes Other   . Hypertension Other   . Kidney disease Other   . Kidney disease Father     Social History Social History   Tobacco Use  . Smoking status: Current Every Day Smoker    Packs/day: 0.50    Years: 7.00    Pack years: 3.50    Types: Cigarettes  . Smokeless tobacco: Never Used  Substance Use Topics  . Alcohol use: Not Currently  . Drug use: No     Allergies   Fish allergy and Penicillins   Review of Systems Review of Systems  Constitutional: Negative for fever.  HENT: Negative for congestion and sore throat.   Eyes:  Negative.   Respiratory: Negative for chest tightness and shortness of breath.   Cardiovascular: Negative for chest pain.  Gastrointestinal: Positive for abdominal pain, nausea and vomiting.  Genitourinary: Negative.   Musculoskeletal: Negative for arthralgias, joint swelling and neck pain.  Skin: Negative.  Negative for rash and wound.  Neurological: Negative for dizziness, weakness, light-headedness, numbness and headaches.  Psychiatric/Behavioral: Negative.      Physical Exam Updated Vital Signs BP 114/61 (BP Location: Right Arm)   Pulse 68   Temp 98.7 F (37.1 C) (Oral)   Resp 18   Ht 6\' 2"  (1.88 m)   Wt 95.3 kg   SpO2 99%   BMI 26.96 kg/m   Physical Exam Vitals signs and nursing note reviewed.  Constitutional:      Appearance: He is well-developed.  HENT:     Head: Normocephalic and atraumatic.     Nose: Nose normal. No congestion.     Mouth/Throat:     Mouth: Mucous membranes are moist.  Eyes:     Conjunctiva/sclera: Conjunctivae normal.  Neck:     Musculoskeletal: Normal range of motion.  Cardiovascular:     Rate and Rhythm: Normal rate and regular rhythm.     Heart sounds: Normal heart sounds.  Pulmonary:     Effort: Pulmonary effort is normal. No respiratory distress.     Breath sounds: Normal breath sounds. No stridor. No wheezing or rhonchi.  Abdominal:     General: Abdomen is flat. Bowel sounds are increased.     Palpations: Abdomen is soft. There is no mass.     Tenderness: There is generalized abdominal tenderness and tenderness in the periumbilical area. There is no guarding or rebound. Negative signs include Murphy's sign.     Comments: Generalized abdominal pain without guarding, no focal pain.  Musculoskeletal: Normal range of motion.  Skin:    General: Skin is warm and dry.  Neurological:     Mental Status: He is alert.      ED Treatments / Results  Labs (all labs ordered are listed, but only abnormal results are displayed) Labs Reviewed   COMPREHENSIVE METABOLIC PANEL - Abnormal; Notable for the following components:      Result Value   AST 50 (*)    ALT 65 (*)    All other components within normal limits  LIPASE, BLOOD  CBC  URINALYSIS, ROUTINE W REFLEX MICROSCOPIC  HEPATITIS PANEL, ACUTE    EKG None  Radiology Dg Chest 2 View  Result Date: 06/18/2018 CLINICAL DATA:  Initial evaluation for acute cough, abdominal pain. EXAM: CHEST - 2 VIEW COMPARISON:  Prior radiograph from 04/14/2018. FINDINGS: The cardiac and mediastinal silhouettes are stable in size  and contour, and remain within normal limits. The lungs are normally inflated. No airspace consolidation, pleural effusion, or pulmonary edema is identified. There is no pneumothorax. No acute osseous abnormality identified. IMPRESSION: No active cardiopulmonary disease. Electronically Signed   By: Rise Mu M.D.   On: 06/18/2018 01:41   Ct Abdomen Pelvis W Contrast  Result Date: 06/18/2018 CLINICAL DATA:  Initial evaluation for acute nausea, vomiting, abdominal pain. EXAM: CT ABDOMEN AND PELVIS WITH CONTRAST TECHNIQUE: Multidetector CT imaging of the abdomen and pelvis was performed using the standard protocol following bolus administration of intravenous contrast. CONTRAST:  OMNIPAQUE IOHEXOL 300 MG/ML  SOLN COMPARISON:  Prior CT from 06/13/2013. FINDINGS: Lower chest: Visualized lung bases are clear. Hepatobiliary: Liver demonstrates a normal contrast enhanced appearance. Focal fat deposition noted adjacent to the falciform ligament. Gallbladder within normal limits. No biliary dilatation. Pancreas: Pancreas within normal limits. Spleen: Spleen within normal limits. Adrenals/Urinary Tract: Adrenal glands are normal. Kidneys equal in size with symmetric enhancement. No nephrolithiasis, hydronephrosis, or focal enhancing renal mass. No appreciable hydroureter. Bladder decompressed without acute finding. Stomach/Bowel: Stomach within normal limits. No evidence  for bowel obstruction. Appendix within normal limits. No acute inflammatory changes seen about the bowels. Vascular/Lymphatic: Normal intravascular enhancement seen throughout the intra-abdominal aorta. Mesenteric vessels patent proximally. No adenopathy. Reproductive: Prostate normal. Other: No free air or fluid. Musculoskeletal: No acute osseous finding. No discrete lytic or blastic osseous lesions. IMPRESSION: No CT evidence for acute intra-abdominal or pelvic process. Electronically Signed   By: Rise Mu M.D.   On: 06/18/2018 01:50    Procedures Procedures (including critical care time)  Medications Ordered in ED Medications  sodium chloride flush (NS) 0.9 % injection 3 mL (3 mLs Intravenous Not Given 06/18/18 0036)  morphine 4 MG/ML injection 4 mg (4 mg Intravenous Given 06/18/18 0029)  ondansetron (ZOFRAN) injection 4 mg (4 mg Intravenous Given 06/18/18 0029)  iohexol (OMNIPAQUE) 300 MG/ML solution 100 mL (100 mLs Intravenous Contrast Given 06/18/18 0101)     Initial Impression / Assessment and Plan / ED Course  I have reviewed the triage vital signs and the nursing notes.  Pertinent labs & imaging results that were available during my care of the patient were reviewed by me and considered in my medical decision making (see chart for details).        Labs and imaging reviewed and discussed with pt. He has slightly elevated LFT's, denies h/o liver problems, reports rare etoh, not recently.  No evidence of intraabdominal infection, cxr clear, no pneumonia.  Suspect viral syndrome, probable infectious from son's recent illness. Hepatiis panel ordered prior to dc.  He tolerated PO intake prior to dc home. Zofran, sparing use of hydrocodone, strict return precautions discussed. Fluids, rest, recheck by pcp if not improving this week with todays tx.   The patient appears reasonably screened and/or stabilized for discharge and I doubt any other medical condition or other [facility]  requiring further screening, evaluation, or treatment in the ED at this time prior to discharge.   Final Clinical Impressions(s) / ED Diagnoses   Final diagnoses:  Non-intractable vomiting with nausea, unspecified vomiting type  Viral syndrome    ED Discharge Orders         Ordered    ondansetron (ZOFRAN ODT) 4 MG disintegrating tablet  Every 8 hours PRN     06/18/18 0157    HYDROcodone-acetaminophen (NORCO/VICODIN) 5-325 MG tablet  Every 4 hours PRN     06/18/18 0202  Burgess Amor, PA-C 06/18/18 1423    Devoria Albe, MD 06/18/18 2259

## 2018-08-03 DIAGNOSIS — F909 Attention-deficit hyperactivity disorder, unspecified type: Secondary | ICD-10-CM | POA: Diagnosis not present

## 2018-08-03 DIAGNOSIS — F17208 Nicotine dependence, unspecified, with other nicotine-induced disorders: Secondary | ICD-10-CM | POA: Diagnosis not present

## 2018-08-03 DIAGNOSIS — J410 Simple chronic bronchitis: Secondary | ICD-10-CM | POA: Diagnosis not present

## 2018-10-04 DIAGNOSIS — M25519 Pain in unspecified shoulder: Secondary | ICD-10-CM | POA: Diagnosis not present

## 2018-10-04 DIAGNOSIS — S299XXA Unspecified injury of thorax, initial encounter: Secondary | ICD-10-CM | POA: Diagnosis not present

## 2018-10-04 DIAGNOSIS — Z79899 Other long term (current) drug therapy: Secondary | ICD-10-CM | POA: Diagnosis not present

## 2018-10-04 DIAGNOSIS — R52 Pain, unspecified: Secondary | ICD-10-CM | POA: Diagnosis not present

## 2018-10-04 DIAGNOSIS — S20211A Contusion of right front wall of thorax, initial encounter: Secondary | ICD-10-CM | POA: Diagnosis not present

## 2018-10-04 DIAGNOSIS — M25511 Pain in right shoulder: Secondary | ICD-10-CM | POA: Diagnosis not present

## 2018-10-04 DIAGNOSIS — R480 Dyslexia and alexia: Secondary | ICD-10-CM | POA: Diagnosis not present

## 2018-10-04 DIAGNOSIS — F319 Bipolar disorder, unspecified: Secondary | ICD-10-CM | POA: Diagnosis not present

## 2018-10-06 DIAGNOSIS — R0789 Other chest pain: Secondary | ICD-10-CM | POA: Diagnosis not present

## 2018-11-04 ENCOUNTER — Ambulatory Visit (HOSPITAL_COMMUNITY)
Admission: RE | Admit: 2018-11-04 | Discharge: 2018-11-04 | Disposition: A | Discharge status: Home / Self Care | Payer: No Typology Code available for payment source | Source: Ambulatory Visit | Attending: Orthopaedic Surgery | Admitting: Orthopaedic Surgery

## 2018-11-04 ENCOUNTER — Other Ambulatory Visit (HOSPITAL_COMMUNITY): Payer: Self-pay | Admitting: Orthopaedic Surgery

## 2018-11-04 ENCOUNTER — Other Ambulatory Visit: Payer: Self-pay

## 2018-11-04 DIAGNOSIS — M545 Low back pain, unspecified: Secondary | ICD-10-CM

## 2018-11-04 DIAGNOSIS — M75101 Unspecified rotator cuff tear or rupture of right shoulder, not specified as traumatic: Secondary | ICD-10-CM | POA: Insufficient documentation

## 2018-11-04 DIAGNOSIS — M542 Cervicalgia: Secondary | ICD-10-CM | POA: Diagnosis not present

## 2018-12-31 DIAGNOSIS — A5903 Trichomonal cystitis and urethritis: Secondary | ICD-10-CM | POA: Diagnosis not present

## 2019-01-18 DIAGNOSIS — A549 Gonococcal infection, unspecified: Secondary | ICD-10-CM | POA: Diagnosis not present

## 2019-01-18 DIAGNOSIS — A64 Unspecified sexually transmitted disease: Secondary | ICD-10-CM | POA: Diagnosis not present

## 2019-04-29 ENCOUNTER — Other Ambulatory Visit: Payer: Self-pay

## 2019-04-29 ENCOUNTER — Emergency Department (HOSPITAL_COMMUNITY): Payer: Medicare Other

## 2019-04-29 ENCOUNTER — Encounter (HOSPITAL_COMMUNITY): Payer: Self-pay | Admitting: *Deleted

## 2019-04-29 ENCOUNTER — Emergency Department (HOSPITAL_COMMUNITY)
Admission: EM | Admit: 2019-04-29 | Discharge: 2019-04-29 | Disposition: A | Discharge status: Home / Self Care | Payer: Medicare Other | Attending: Emergency Medicine | Admitting: Emergency Medicine

## 2019-04-29 DIAGNOSIS — R109 Unspecified abdominal pain: Secondary | ICD-10-CM | POA: Diagnosis not present

## 2019-04-29 DIAGNOSIS — M545 Low back pain, unspecified: Secondary | ICD-10-CM

## 2019-04-29 DIAGNOSIS — J45909 Unspecified asthma, uncomplicated: Secondary | ICD-10-CM | POA: Insufficient documentation

## 2019-04-29 DIAGNOSIS — Z79899 Other long term (current) drug therapy: Secondary | ICD-10-CM | POA: Insufficient documentation

## 2019-04-29 DIAGNOSIS — F17210 Nicotine dependence, cigarettes, uncomplicated: Secondary | ICD-10-CM | POA: Diagnosis not present

## 2019-04-29 LAB — CBC WITH DIFFERENTIAL/PLATELET
Abs Immature Granulocytes: 0.02 10*3/uL (ref 0.00–0.07)
Basophils Absolute: 0 10*3/uL (ref 0.0–0.1)
Basophils Relative: 0 %
Eosinophils Absolute: 0.1 10*3/uL (ref 0.0–0.5)
Eosinophils Relative: 1 %
HCT: 47.6 % (ref 39.0–52.0)
Hemoglobin: 14.7 g/dL (ref 13.0–17.0)
Immature Granulocytes: 0 %
Lymphocytes Relative: 34 %
Lymphs Abs: 2.2 10*3/uL (ref 0.7–4.0)
MCH: 28.8 pg (ref 26.0–34.0)
MCHC: 30.9 g/dL (ref 30.0–36.0)
MCV: 93.2 fL (ref 80.0–100.0)
Monocytes Absolute: 0.4 10*3/uL (ref 0.1–1.0)
Monocytes Relative: 7 %
Neutro Abs: 3.6 10*3/uL (ref 1.7–7.7)
Neutrophils Relative %: 58 %
Platelets: 182 10*3/uL (ref 150–400)
RBC: 5.11 MIL/uL (ref 4.22–5.81)
RDW: 12.7 % (ref 11.5–15.5)
WBC: 6.3 10*3/uL (ref 4.0–10.5)
nRBC: 0 % (ref 0.0–0.2)

## 2019-04-29 LAB — BASIC METABOLIC PANEL
Anion gap: 5 (ref 5–15)
BUN: 15 mg/dL (ref 6–20)
CO2: 29 mmol/L (ref 22–32)
Calcium: 8.9 mg/dL (ref 8.9–10.3)
Chloride: 106 mmol/L (ref 98–111)
Creatinine, Ser: 1 mg/dL (ref 0.61–1.24)
GFR calc Af Amer: >60 mL/min (ref >60)
GFR calc non Af Amer: >60 mL/min (ref >60)
Glucose, Bld: 92 mg/dL (ref 70–99)
Potassium: 4.1 mmol/L (ref 3.5–5.1)
Sodium: 140 mmol/L (ref 135–145)

## 2019-04-29 LAB — URINALYSIS, ROUTINE W REFLEX MICROSCOPIC
Bilirubin Urine: NEGATIVE
Glucose, UA: NEGATIVE mg/dL
Hgb urine dipstick: NEGATIVE
Ketones, ur: NEGATIVE mg/dL
Leukocytes,Ua: NEGATIVE
Nitrite: NEGATIVE
Protein, ur: NEGATIVE mg/dL
Specific Gravity, Urine: 1.023 (ref 1.005–1.030)
pH: 5 (ref 5.0–8.0)

## 2019-04-29 LAB — HIV ANTIBODY (ROUTINE TESTING W REFLEX): HIV Screen 4th Generation wRfx: NONREACTIVE

## 2019-04-29 LAB — RPR: RPR Ser Ql: NONREACTIVE

## 2019-04-29 MED ORDER — KETOROLAC TROMETHAMINE 30 MG/ML IJ SOLN
15.0000 mg | Freq: Once | INTRAMUSCULAR | Status: AC
  Administered 2019-04-29: 15 mg via INTRAVENOUS
  Filled 2019-04-29: qty 1

## 2019-04-29 MED ORDER — NAPROXEN 500 MG PO TABS: 500.0000 mg | ORAL_TABLET | Freq: Two times a day (BID) | ORAL | 0 refills | Status: DC

## 2019-04-29 MED ORDER — AZITHROMYCIN 250 MG PO TABS
1000.0000 mg | ORAL_TABLET | Freq: Once | ORAL | Status: AC
  Administered 2019-04-29: 11:00:00 1000 mg via ORAL
  Filled 2019-04-29: qty 4

## 2019-04-29 MED ORDER — FENTANYL CITRATE (PF) 100 MCG/2ML IJ SOLN
50.0000 ug | Freq: Once | INTRAMUSCULAR | Status: AC
  Administered 2019-04-29: 09:00:00 50 ug via INTRAVENOUS
  Filled 2019-04-29: qty 2

## 2019-04-29 MED ORDER — METRONIDAZOLE 500 MG PO TABS
2000.0000 mg | ORAL_TABLET | Freq: Once | ORAL | Status: AC
  Administered 2019-04-29: 11:00:00 2000 mg via ORAL
  Filled 2019-04-29: qty 4

## 2019-04-29 MED ORDER — METHOCARBAMOL 500 MG PO TABS: 500.0000 mg | ORAL_TABLET | Freq: Three times a day (TID) | ORAL | 0 refills | Status: DC | PRN

## 2019-04-29 MED ORDER — SODIUM CHLORIDE 0.9 % IV BOLUS
1000.0000 mL | Freq: Once | INTRAVENOUS | Status: AC
  Administered 2019-04-29: 1000 mL via INTRAVENOUS

## 2019-04-29 MED ORDER — DIAZEPAM 5 MG PO TABS
5.0000 mg | ORAL_TABLET | Freq: Once | ORAL | Status: AC
  Administered 2019-04-29: 09:00:00 5 mg via ORAL
  Filled 2019-04-29: qty 1

## 2019-04-29 NOTE — ED Provider Notes (Signed)
[facility] EMERGENCY DEPARTMENT Provider Note   CSN: [id number] Arrival date & time: 04/29/19  9937     History Chief Complaint  Patient presents with  . Back Pain    Gregory Cox is a 34 y.o. male with a hx of tobacco abuse, asthma, ADD, & bipolar disorder who presents to the ED with complaints of lower back pain x 1 week. Patient states pain is located in the bilateral lower back, at times radiates into the L groin area but not to the testicle, feels like a stabbing discomfort, currently a 10/10 in severity, worse with certain movements, alleviated in certain positions some and with the 1 muscle relaxer he had left over from a prior injury. No specific trauma/change in activity. He used to received spinal injections but has not in several months. This back pain seems somewhat similar to what he has experienced in the past but radiating into the left groin is new. Upon further question he does mention that he was seen @ an alternative ED for an STD and was told to get rechecked in 3 months he would like to do this today as well, he received tx w/ abx at that time, he states he occasionally notes some penile discharge and darker appearing urine. Denies dysuria, hematuria, frequency, pain with bowel movements, urgency, genital or testicular pain/swelling. This discharge is minimal and does not feels as bad as his prior STD. Denies numbness, tingling, weakness, saddle anesthesia, incontinence to bowel/bladder, fever, chills, IV drug use, or hx of cancer.    HPI     Past Medical History:  Diagnosis Date  . Acute kidney failure (HCC)   . Asthma   . Attention deficit disorder (ADD)   . Bipolar 1 disorder (HCC)   . Insomnia     Patient Active Problem List   Diagnosis Date Noted  . Ankle sprain 12/22/2012  . Complete rotator cuff tear of left shoulder 11/30/2012  . Cervicalgia 11/30/2012    Past Surgical History:  Procedure Laterality Date  . WISDOM TOOTH EXTRACTION         Family  History  Problem Relation Age of Onset  . Diabetes Mother   . Hypertension Mother   . Diabetes Other   . Hypertension Other   . Kidney disease Other   . Kidney disease Father     Social History   Tobacco Use  . Smoking status: Current Every Day Smoker    Packs/day: 0.50    Years: 7.00    Pack years: 3.50    Types: Cigarettes  . Smokeless tobacco: Never Used  Substance Use Topics  . Alcohol use: Not Currently  . Drug use: No    Home Medications Prior to Admission medications   Medication Sig Start Date End Date Taking? Authorizing Provider  Alum & Mag Hydroxide-Simeth (MAGIC MOUTHWASH W/LIDOCAINE) SOLN Take 5 mLs by mouth 3 (three) times daily as needed for mouth pain. 09/05/13   Sunnie Nielsen, MD  ARIPiprazole (ABILIFY) 20 MG tablet Take 20 mg by mouth daily.    [provider]  cephALEXin (KEFLEX) 500 MG capsule Take 1 capsule (500 mg total) by mouth 3 (three) times daily. For 10 days 12/29/13   Triplett, Tammy, PA-C  clindamycin (CLEOCIN) 300 MG capsule Take 1 capsule (300 mg total) by mouth 3 (three) times daily. 09/14/15   Burgess Amor, PA-C  cyclobenzaprine (FLEXERIL) 10 MG tablet Take 1 tablet (10 mg total) by mouth 3 (three) times daily. 03/26/17   Ivery Quale,  PA-C  diclofenac (VOLTAREN) 75 MG EC tablet Take 1 tablet (75 mg total) by mouth 2 (two) times daily. Take with food 12/26/17   Triplett, Tammy, PA-C  HYDROcodone-acetaminophen (NORCO/VICODIN) 5-325 MG tablet Take 1 tablet by mouth every 4 (four) hours as needed. 06/18/18   Burgess Amor, PA-C  ibuprofen (ADVIL,MOTRIN) 600 MG tablet Take 1 tablet (600 mg total) by mouth 4 (four) times daily. 03/26/17   Ivery Quale, PA-C  ondansetron (ZOFRAN ODT) 4 MG disintegrating tablet Take 1 tablet (4 mg total) by mouth every 8 (eight) hours as needed for nausea or vomiting. 06/18/18   Idol, Raynelle Fanning, PA-C  predniSONE (DELTASONE) 20 MG tablet Take 2 tablets (40 mg total) by mouth daily. 04/15/18   Benjiman Core, MD  traMADol  (ULTRAM) 50 MG tablet Take 1 tablet (50 mg total) by mouth every 6 (six) hours as needed. 03/26/17   Ivery Quale, PA-C  traZODone (DESYREL) 100 MG tablet Take 100 mg by mouth at bedtime.    [provider]    Allergies    Fish allergy and Penicillins  Review of Systems   Review of Systems  Constitutional: Negative for chills, fever and unexpected weight change.  Gastrointestinal: Negative for abdominal pain, nausea and vomiting.  Genitourinary: Positive for discharge. Negative for dysuria, frequency, genital sores, penile pain, penile swelling, scrotal swelling and testicular pain.  Musculoskeletal: Positive for back pain.  Neurological: Negative for weakness and numbness.       Negative for saddle anesthesia or bowel/bladder incontinence.   All other systems reviewed and are negative.   Physical Exam Updated Vital Signs BP 129/60 (BP Location: Right Arm)   Pulse (!) 52   Temp 97.7 F (36.5 C) (Oral)   Resp 19   Ht 6\' 2"  (1.88 m)   Wt 95.3 kg   SpO2 100%   BMI 26.96 kg/m   Physical Exam Vitals and nursing note reviewed. Exam conducted with a chaperone present.  Constitutional:      General: He is not in acute distress.    Appearance: He is well-developed. He is not toxic-appearing.  HENT:     Head: Normocephalic and atraumatic.  Eyes:     General:        Right eye: No discharge.        Left eye: No discharge.     Conjunctiva/sclera: Conjunctivae normal.  Cardiovascular:     Rate and Rhythm: Regular rhythm. Bradycardia present.  Pulmonary:     Effort: Pulmonary effort is normal. No respiratory distress.     Breath sounds: Normal breath sounds. No wheezing, rhonchi or rales.  Abdominal:     General: There is no distension.     Palpations: Abdomen is soft.     Tenderness: There is no guarding or rebound.     Hernia: There is no hernia in the left inguinal area or right inguinal area.    Genitourinary:    Penis: Circumcised. No erythema, tenderness,  discharge, swelling or lesions.      Testes: Normal.        Right: Mass, tenderness or swelling not present.        Left: Mass, tenderness or swelling not present.     Epididymis:     Right: Normal. Not inflamed or enlarged.     Left: Normal. Not inflamed or enlarged.     Comments: Fred NT present as chaperone.  Musculoskeletal:     Cervical back: Normal range of motion and neck supple. No spinous process  tenderness or muscular tenderness.     Comments: No obvious deformity, appreciable swelling, erythema, ecchymosis, significant open wounds, or increased warmth.  Extremities: Normal ROM. Nontender.  Back: No point/focal vertebral tenderness, no palpable step off or crepitus. Tenderness diffusely throughout the lower thoracic & the lumbar spine to midline & bilateral paraspinal muscles, L>R.   Lymphadenopathy:     Lower Body: No right inguinal adenopathy. No left inguinal adenopathy.  Skin:    General: Skin is warm and dry.     Findings: No rash.  Neurological:     Mental Status: He is alert.     Deep Tendon Reflexes:     Reflex Scores:      Patellar reflexes are 2+ on the right side and 2+ on the left side.    Comments: Sensation grossly intact to bilateral lower extremities. 5/5 symmetric strength with plantar/dorsiflexion bilaterally. Gait is intact without obvious foot drop.   Psychiatric:        Behavior: Behavior normal.     ED Results / Procedures / Treatments   Labs (all labs ordered are listed, but only abnormal results are displayed) Labs Reviewed  URINE CULTURE  CBC WITH DIFFERENTIAL/PLATELET  BASIC METABOLIC PANEL  URINALYSIS, ROUTINE W REFLEX MICROSCOPIC  RPR  HIV ANTIBODY (ROUTINE TESTING W REFLEX)  GC/CHLAMYDIA PROBE AMP ([place]) NOT AT [facility]    EKG None  Radiology CT Renal Stone Study  Result Date: 04/29/2019 CLINICAL DATA:  Flank and low back pain, concern for urinary tract obstruction EXAM: CT ABDOMEN AND PELVIS WITHOUT CONTRAST TECHNIQUE:  Multidetector CT imaging of the abdomen and pelvis was performed following the standard protocol without IV contrast. COMPARISON:  06/18/2018 FINDINGS: Lower chest: No acute abnormality. Hepatobiliary: No focal liver abnormality is seen. No gallstones, gallbladder wall thickening, or biliary dilatation. Pancreas: Unremarkable. No pancreatic ductal dilatation or surrounding inflammatory changes. Spleen: Normal in size without focal abnormality. Adrenals/Urinary Tract: Adrenal glands are unremarkable. Kidneys are normal, without renal calculi, focal lesion, or hydronephrosis. Bladder is unremarkable. Stomach/Bowel: Stomach is within normal limits. Appendix appears normal. No evidence of bowel wall thickening, distention, or inflammatory changes. Vascular/Lymphatic: Limited without contrast. Negative for aneurysm. No bulky adenopathy. Reproductive: No significant finding by CT. Other: No abdominal wall hernia or abnormality. No abdominopelvic ascites. Musculoskeletal: No acute or significant osseous findings. IMPRESSION: Negative for acute obstructive uropathy, urinary tract or ureteral calculus. No other acute intra-abdominopelvic finding by noncontrast CT. Electronically Signed   By: Jerilynn Mages.  Shick M.D.   On: 04/29/2019 09:24    Procedures Procedures (including critical care time)  Medications Ordered in ED Medications  sodium chloride 0.9 % bolus 1,000 mL (0 mLs Intravenous Stopped 04/29/19 1112)  fentaNYL (SUBLIMAZE) injection 50 mcg (50 mcg Intravenous Given 04/29/19 0851)  diazepam (VALIUM) tablet 5 mg (5 mg Oral Given 04/29/19 0851)  ketorolac (TORADOL) 30 MG/ML injection 15 mg (15 mg Intravenous Given 04/29/19 1111)  azithromycin (ZITHROMAX) tablet 1,000 mg (1,000 mg Oral Given 04/29/19 1109)  metroNIDAZOLE (FLAGYL) tablet 2,000 mg (2,000 mg Oral Given 04/29/19 1109)    ED Course  I have reviewed the triage vital signs and the nursing notes.  Pertinent labs & imaging results that were available during my care  of the patient were reviewed by me and considered in my medical decision making (see chart for details).    MDM Rules/Calculators/A&P                     Patient presents to the emergency  department with complaints of lower back pain for the past 1 week.  Patient is nontoxic-appearing, resting comfortably, vitals notable for mild bradycardia, otherwise WNL.  On exam he has diffuse lumbar and lower thoracic tenderness to palpation more prominently to the paraspinal muscles left greater than right, he also has some tenderness to palpation along the left inguinal crease.  No adenopathy.  GU exam is unremarkable.  Plan for CT renal study as well as labs.  CBC: No leukocytosis or anemia BMP: No electrolyte derangement.  Renal function preserved. Urinalysis: No hematuria or UTI. GC/chlamydia/HIV/RPR are pending. CT renal stone study:Negative for acute obstructive uropathy, urinary tract or ureteral calculus. No other acute intra-abdominopelvic finding by noncontrast CT.  Overall reassuring work-up.  Suspect patient's back pain is most likely musculoskeletal, abdomen without peritoneal signs, again GU exam unremarkable, no H&P components to suggest orchitis, epididymitis, or prostatitis, urinalysis without infection, no neuro deficits, no fever/IVDU to suggest epidural abscess.  Will treat back pain with naproxen and Robaxin, discussed no driving or operating heavy machinery with Robaxin.  Regarding patient's penile discharge, will provide prophylaxis for trichomonas as he has had this previously as well as provide azithromycin, hold off on Rocephin given he has a penicillin allergy, will defer to results.  Patient aware of pending STD results and need to inform all sexual partners of positive and seek care as needed.  Health department follow-up.  PCP follow-up. I discussed results, treatment plan, need for follow-up, and return precautions with the patient. Provided opportunity for questions, patient  confirmed understanding and is in agreement with plan.    Final Clinical Impression(s) / ED Diagnoses Final diagnoses:  Acute bilateral low back pain without sciatica    Rx / DC Orders ED Discharge Orders         Ordered    naproxen (NAPROSYN) 500 MG tablet  2 times daily     04/29/19 1105    methocarbamol (ROBAXIN) 500 MG tablet  Every 8 hours PRN     04/29/19 1105           Azaleah Usman, [place] R, PA-C 04/29/19 1143    Vanetta Mulders, MD 05/12/19 1517

## 2019-04-29 NOTE — ED Triage Notes (Signed)
Patient presents to the ED for lower back for one week, worsening last night and this morning.  Patient denies injury.

## 2019-04-29 NOTE — Discharge Instructions (Addendum)
You were seen in the emergency department for back pain. Your labs & CT imaging are all reassuring.  We suspect your back pain is muscular in nature. We are sending you home with the following medicines for this:  - Naproxen is a nonsteroidal anti-inflammatory medication that will help with pain and swelling. Be sure to take this medication as prescribed with food, 1 pill every 12 hours,  It should be taken with food, as it can cause stomach upset, and more seriously, stomach bleeding. Do not take other nonsteroidal anti-inflammatory medications with this such as Advil, Motrin, Aleve, Mobic, Goodie Powder, or Motrin.    - Robaxin is the muscle relaxer I have prescribed, this is meant to help with muscle tightness. Be aware that this medication may make you drowsy therefore the first time you take this it should be at a time you are in an environment where you can rest. Do not drive or operate heavy machinery when taking this medication. Do not drink alcohol or take other sedating medications with this medicine such as narcotics or benzodiazepines.   You make take Tylenol per over the counter dosing with these medications.   We have prescribed you new medication(s) today. Discuss the medications prescribed today with your pharmacist as they can have adverse effects and interactions with your other medicines including over the counter and prescribed medications. Seek medical evaluation if you start to experience new or abnormal symptoms after taking one of these medicines, seek care immediately if you start to experience difficulty breathing, feeling of your throat closing, facial swelling, or rash as these could be indications of a more serious allergic reaction     We have tested you for chlamydia, gonorrhea, HIV, & syphilis- we will call you if results are positive, if positive you will need to inform all sexual partners. We have treated you for chlamydia and trichomonas in the ER, if others are  positive we will discuss with you further needs of treatment. Please do not have sex of any kind for a minimum of 1 week to prevent re-infection.   Follow-up at the health department within 1 week for reevaluation of your sexually transmitted infection concerns.  Follow with your primary care provider or with Dr. Romeo Apple, orthopedic surgeon, within 1 week for reevaluation of your back pain.  Return to the emergency department for new or worsening symptoms including but not limited to worsening pain, trouble walking, numbness/weakness in your legs, loss of control of bowel or bladder function, fever, or any other concern

## 2019-04-30 LAB — URINE CULTURE: Culture: NO GROWTH

## 2019-05-02 LAB — GC/CHLAMYDIA PROBE AMP (~~LOC~~) NOT AT ARMC
Chlamydia: NEGATIVE
Neisseria Gonorrhea: NEGATIVE

## 2019-11-17 DIAGNOSIS — F17210 Nicotine dependence, cigarettes, uncomplicated: Secondary | ICD-10-CM | POA: Diagnosis not present

## 2019-11-17 DIAGNOSIS — J410 Simple chronic bronchitis: Secondary | ICD-10-CM | POA: Diagnosis not present

## 2019-11-17 DIAGNOSIS — F3130 Bipolar disorder, current episode depressed, mild or moderate severity, unspecified: Secondary | ICD-10-CM | POA: Diagnosis not present

## 2019-11-17 DIAGNOSIS — F17208 Nicotine dependence, unspecified, with other nicotine-induced disorders: Secondary | ICD-10-CM | POA: Diagnosis not present

## 2019-11-17 DIAGNOSIS — F909 Attention-deficit hyperactivity disorder, unspecified type: Secondary | ICD-10-CM | POA: Diagnosis not present

## 2020-02-18 DIAGNOSIS — M545 Low back pain, unspecified: Secondary | ICD-10-CM | POA: Diagnosis not present

## 2020-02-18 DIAGNOSIS — J45909 Unspecified asthma, uncomplicated: Secondary | ICD-10-CM | POA: Insufficient documentation

## 2020-02-18 DIAGNOSIS — F17210 Nicotine dependence, cigarettes, uncomplicated: Secondary | ICD-10-CM | POA: Diagnosis not present

## 2020-02-18 DIAGNOSIS — R52 Pain, unspecified: Secondary | ICD-10-CM | POA: Diagnosis not present
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
## 2020-02-19 ENCOUNTER — Emergency Department (HOSPITAL_COMMUNITY)
Admission: EM | Admit: 2020-02-19 | Discharge: 2020-02-19 | Disposition: A | Discharge status: Home / Self Care | Payer: No Typology Code available for payment source | Attending: Emergency Medicine | Admitting: Emergency Medicine

## 2020-02-19 ENCOUNTER — Other Ambulatory Visit: Payer: Self-pay

## 2020-02-19 ENCOUNTER — Encounter (HOSPITAL_COMMUNITY): Payer: Self-pay | Admitting: Emergency Medicine

## 2020-02-19 DIAGNOSIS — M545 Low back pain, unspecified: Secondary | ICD-10-CM | POA: Diagnosis not present

## 2020-02-19 MED ORDER — IBUPROFEN 400 MG PO TABS
600.0000 mg | ORAL_TABLET | Freq: Once | ORAL | Status: AC
  Administered 2020-02-19: 600 mg via ORAL
  Filled 2020-02-19: qty 2

## 2020-02-19 MED ORDER — IBUPROFEN 600 MG PO TABS: 600.0000 mg | ORAL_TABLET | Freq: Four times a day (QID) | ORAL | 0 refills | Status: DC | PRN

## 2020-02-19 MED ORDER — METHOCARBAMOL 750 MG PO TABS: ORAL_TABLET | ORAL | 0 refills | Status: DC

## 2020-02-19 NOTE — Discharge Instructions (Addendum)
Ice packs to the injured or sore muscles for the next several days then start using heat. Take the medications for pain and muscle spasms. Return to the ED for any problems listed on the head injury sheet. Recheck if you aren't improving in the next week by Dr Felecia Shelling.
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
## 2020-03-29 DIAGNOSIS — J410 Simple chronic bronchitis: Secondary | ICD-10-CM | POA: Diagnosis not present

## 2020-03-29 DIAGNOSIS — F17208 Nicotine dependence, unspecified, with other nicotine-induced disorders: Secondary | ICD-10-CM | POA: Diagnosis not present

## 2020-03-29 DIAGNOSIS — F3130 Bipolar disorder, current episode depressed, mild or moderate severity, unspecified: Secondary | ICD-10-CM | POA: Diagnosis not present

## 2020-03-29 DIAGNOSIS — L0232 Furuncle of buttock: Secondary | ICD-10-CM | POA: Diagnosis not present

## 2020-12-27 ENCOUNTER — Inpatient Hospital Stay (HOSPITAL_COMMUNITY)
Admission: EM | Admit: 2020-12-27 | Discharge: 2020-12-30 | DRG: 073 | Disposition: A | Discharge status: Home / Self Care | Payer: Medicare Other | Attending: General Surgery | Admitting: General Surgery

## 2020-12-27 DIAGNOSIS — T1490XA Injury, unspecified, initial encounter: Principal | ICD-10-CM

## 2020-12-27 DIAGNOSIS — F419 Anxiety disorder, unspecified: Secondary | ICD-10-CM | POA: Diagnosis present

## 2020-12-27 DIAGNOSIS — F209 Schizophrenia, unspecified: Secondary | ICD-10-CM | POA: Diagnosis present

## 2020-12-27 DIAGNOSIS — S3991XA Unspecified injury of abdomen, initial encounter: Secondary | ICD-10-CM | POA: Diagnosis not present

## 2020-12-27 DIAGNOSIS — S143XXA Injury of brachial plexus, initial encounter: Principal | ICD-10-CM | POA: Diagnosis present

## 2020-12-27 DIAGNOSIS — S30811A Abrasion of abdominal wall, initial encounter: Secondary | ICD-10-CM | POA: Diagnosis not present

## 2020-12-27 DIAGNOSIS — J9690 Respiratory failure, unspecified, unspecified whether with hypoxia or hypercapnia: Secondary | ICD-10-CM | POA: Diagnosis not present

## 2020-12-27 DIAGNOSIS — R404 Transient alteration of awareness: Secondary | ICD-10-CM | POA: Diagnosis not present

## 2020-12-27 DIAGNOSIS — R402242 Coma scale, best verbal response, confused conversation, at arrival to emergency department: Secondary | ICD-10-CM | POA: Diagnosis present

## 2020-12-27 DIAGNOSIS — S32039S Unspecified fracture of third lumbar vertebra, sequela: Secondary | ICD-10-CM | POA: Diagnosis not present

## 2020-12-27 DIAGNOSIS — S32039A Unspecified fracture of third lumbar vertebra, initial encounter for closed fracture: Secondary | ICD-10-CM | POA: Diagnosis present

## 2020-12-27 DIAGNOSIS — Z880 Allergy status to penicillin: Secondary | ICD-10-CM

## 2020-12-27 DIAGNOSIS — S2241XA Multiple fractures of ribs, right side, initial encounter for closed fracture: Secondary | ICD-10-CM | POA: Diagnosis not present

## 2020-12-27 DIAGNOSIS — R402352 Coma scale, best motor response, localizes pain, at arrival to emergency department: Secondary | ICD-10-CM | POA: Diagnosis present

## 2020-12-27 DIAGNOSIS — S32029A Unspecified fracture of second lumbar vertebra, initial encounter for closed fracture: Secondary | ICD-10-CM | POA: Diagnosis present

## 2020-12-27 DIAGNOSIS — Z9114 Patient's other noncompliance with medication regimen: Secondary | ICD-10-CM | POA: Diagnosis not present

## 2020-12-27 DIAGNOSIS — S32019A Unspecified fracture of first lumbar vertebra, initial encounter for closed fracture: Secondary | ICD-10-CM | POA: Diagnosis not present

## 2020-12-27 DIAGNOSIS — S62617A Displaced fracture of proximal phalanx of left little finger, initial encounter for closed fracture: Secondary | ICD-10-CM | POA: Diagnosis present

## 2020-12-27 DIAGNOSIS — U071 COVID-19: Secondary | ICD-10-CM | POA: Diagnosis not present

## 2020-12-27 DIAGNOSIS — Z886 Allergy status to analgesic agent status: Secondary | ICD-10-CM

## 2020-12-27 DIAGNOSIS — R402142 Coma scale, eyes open, spontaneous, at arrival to emergency department: Secondary | ICD-10-CM | POA: Diagnosis present

## 2020-12-27 DIAGNOSIS — Z23 Encounter for immunization: Secondary | ICD-10-CM

## 2020-12-27 DIAGNOSIS — S40211A Abrasion of right shoulder, initial encounter: Secondary | ICD-10-CM | POA: Diagnosis not present

## 2020-12-27 DIAGNOSIS — Z4682 Encounter for fitting and adjustment of non-vascular catheter: Secondary | ICD-10-CM | POA: Diagnosis not present

## 2020-12-27 DIAGNOSIS — S59911A Unspecified injury of right forearm, initial encounter: Secondary | ICD-10-CM | POA: Diagnosis not present

## 2020-12-27 DIAGNOSIS — R4182 Altered mental status, unspecified: Secondary | ICD-10-CM | POA: Diagnosis not present

## 2020-12-27 DIAGNOSIS — R609 Edema, unspecified: Secondary | ICD-10-CM | POA: Diagnosis present

## 2020-12-27 DIAGNOSIS — Z9911 Dependence on respirator [ventilator] status: Secondary | ICD-10-CM | POA: Diagnosis not present

## 2020-12-27 DIAGNOSIS — R600 Localized edema: Secondary | ICD-10-CM | POA: Diagnosis not present

## 2020-12-27 DIAGNOSIS — R0603 Acute respiratory distress: Secondary | ICD-10-CM | POA: Diagnosis not present

## 2020-12-27 DIAGNOSIS — S43201A Unspecified subluxation of right sternoclavicular joint, initial encounter: Secondary | ICD-10-CM | POA: Diagnosis not present

## 2020-12-27 DIAGNOSIS — S32049A Unspecified fracture of fourth lumbar vertebra, initial encounter for closed fracture: Secondary | ICD-10-CM | POA: Diagnosis not present

## 2020-12-27 DIAGNOSIS — J9600 Acute respiratory failure, unspecified whether with hypoxia or hypercapnia: Secondary | ICD-10-CM | POA: Diagnosis not present

## 2020-12-27 DIAGNOSIS — R456 Violent behavior: Secondary | ICD-10-CM | POA: Diagnosis not present

## 2020-12-27 DIAGNOSIS — S4991XA Unspecified injury of right shoulder and upper arm, initial encounter: Secondary | ICD-10-CM | POA: Diagnosis not present

## 2020-12-27 DIAGNOSIS — S0990XA Unspecified injury of head, initial encounter: Secondary | ICD-10-CM | POA: Diagnosis not present

## 2020-12-27 DIAGNOSIS — S199XXA Unspecified injury of neck, initial encounter: Secondary | ICD-10-CM | POA: Diagnosis not present

## 2020-12-27 DIAGNOSIS — R0689 Other abnormalities of breathing: Secondary | ICD-10-CM | POA: Diagnosis not present

## 2020-12-27 MED ORDER — HALOPERIDOL LACTATE 5 MG/ML IJ SOLN
INTRAMUSCULAR | Status: AC
  Filled 2020-12-27: qty 1
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
## 2020-12-28 ENCOUNTER — Emergency Department (HOSPITAL_COMMUNITY): Payer: Medicare Other

## 2020-12-28 ENCOUNTER — Inpatient Hospital Stay (HOSPITAL_COMMUNITY): Payer: Medicare Other

## 2020-12-28 DIAGNOSIS — Z23 Encounter for immunization: Secondary | ICD-10-CM | POA: Diagnosis present

## 2020-12-28 DIAGNOSIS — R4182 Altered mental status, unspecified: Secondary | ICD-10-CM | POA: Diagnosis not present

## 2020-12-28 DIAGNOSIS — Z9114 Patient's other noncompliance with medication regimen: Secondary | ICD-10-CM | POA: Diagnosis not present

## 2020-12-28 DIAGNOSIS — S199XXA Unspecified injury of neck, initial encounter: Secondary | ICD-10-CM | POA: Diagnosis not present

## 2020-12-28 DIAGNOSIS — R402142 Coma scale, eyes open, spontaneous, at arrival to emergency department: Secondary | ICD-10-CM | POA: Diagnosis present

## 2020-12-28 DIAGNOSIS — S43201A Unspecified subluxation of right sternoclavicular joint, initial encounter: Secondary | ICD-10-CM | POA: Diagnosis not present

## 2020-12-28 DIAGNOSIS — S0990XA Unspecified injury of head, initial encounter: Secondary | ICD-10-CM | POA: Diagnosis not present

## 2020-12-28 DIAGNOSIS — S59911A Unspecified injury of right forearm, initial encounter: Secondary | ICD-10-CM | POA: Diagnosis not present

## 2020-12-28 DIAGNOSIS — R402242 Coma scale, best verbal response, confused conversation, at arrival to emergency department: Secondary | ICD-10-CM | POA: Diagnosis present

## 2020-12-28 DIAGNOSIS — S32039S Unspecified fracture of third lumbar vertebra, sequela: Secondary | ICD-10-CM | POA: Diagnosis not present

## 2020-12-28 DIAGNOSIS — R600 Localized edema: Secondary | ICD-10-CM | POA: Diagnosis not present

## 2020-12-28 DIAGNOSIS — S32039A Unspecified fracture of third lumbar vertebra, initial encounter for closed fracture: Secondary | ICD-10-CM | POA: Diagnosis present

## 2020-12-28 DIAGNOSIS — Z4682 Encounter for fitting and adjustment of non-vascular catheter: Secondary | ICD-10-CM | POA: Diagnosis not present

## 2020-12-28 DIAGNOSIS — J9690 Respiratory failure, unspecified, unspecified whether with hypoxia or hypercapnia: Secondary | ICD-10-CM | POA: Diagnosis not present

## 2020-12-28 DIAGNOSIS — S62617A Displaced fracture of proximal phalanx of left little finger, initial encounter for closed fracture: Secondary | ICD-10-CM | POA: Diagnosis present

## 2020-12-28 DIAGNOSIS — T1490XA Injury, unspecified, initial encounter: Secondary | ICD-10-CM | POA: Diagnosis present

## 2020-12-28 DIAGNOSIS — S143XXA Injury of brachial plexus, initial encounter: Secondary | ICD-10-CM | POA: Diagnosis present

## 2020-12-28 DIAGNOSIS — S32029A Unspecified fracture of second lumbar vertebra, initial encounter for closed fracture: Secondary | ICD-10-CM | POA: Diagnosis present

## 2020-12-28 DIAGNOSIS — R609 Edema, unspecified: Secondary | ICD-10-CM | POA: Diagnosis present

## 2020-12-28 DIAGNOSIS — S2241XA Multiple fractures of ribs, right side, initial encounter for closed fracture: Secondary | ICD-10-CM | POA: Diagnosis present

## 2020-12-28 DIAGNOSIS — F419 Anxiety disorder, unspecified: Secondary | ICD-10-CM | POA: Diagnosis present

## 2020-12-28 DIAGNOSIS — R402352 Coma scale, best motor response, localizes pain, at arrival to emergency department: Secondary | ICD-10-CM | POA: Diagnosis present

## 2020-12-28 DIAGNOSIS — U071 COVID-19: Secondary | ICD-10-CM | POA: Diagnosis present

## 2020-12-28 DIAGNOSIS — J9600 Acute respiratory failure, unspecified whether with hypoxia or hypercapnia: Secondary | ICD-10-CM | POA: Diagnosis not present

## 2020-12-28 DIAGNOSIS — Z9911 Dependence on respirator [ventilator] status: Secondary | ICD-10-CM | POA: Diagnosis not present

## 2020-12-28 DIAGNOSIS — S4991XA Unspecified injury of right shoulder and upper arm, initial encounter: Secondary | ICD-10-CM | POA: Diagnosis not present

## 2020-12-28 DIAGNOSIS — S3991XA Unspecified injury of abdomen, initial encounter: Secondary | ICD-10-CM | POA: Diagnosis not present

## 2020-12-28 DIAGNOSIS — Z880 Allergy status to penicillin: Secondary | ICD-10-CM | POA: Diagnosis not present

## 2020-12-28 DIAGNOSIS — S32019A Unspecified fracture of first lumbar vertebra, initial encounter for closed fracture: Secondary | ICD-10-CM | POA: Diagnosis present

## 2020-12-28 DIAGNOSIS — S32049A Unspecified fracture of fourth lumbar vertebra, initial encounter for closed fracture: Secondary | ICD-10-CM | POA: Diagnosis present

## 2020-12-28 DIAGNOSIS — F209 Schizophrenia, unspecified: Secondary | ICD-10-CM | POA: Diagnosis present

## 2020-12-28 DIAGNOSIS — Z886 Allergy status to analgesic agent status: Secondary | ICD-10-CM | POA: Diagnosis not present

## 2020-12-28 LAB — ETHANOL: Alcohol, Ethyl (B): <10 mg/dL (ref <10)

## 2020-12-28 LAB — LACTIC ACID, PLASMA: Lactic Acid, Venous: 2 mmol/L (ref 0.5–1.9)

## 2020-12-28 LAB — I-STAT ARTERIAL BLOOD GAS, ED
Acid-Base Excess: 2 mmol/L (ref 0.0–2.0)
Bicarbonate: 27.3 mmol/L (ref 20.0–28.0)
Calcium, Ion: 1.15 mmol/L (ref 1.15–1.40)
HCT: 39 % (ref 39.0–52.0)
Hemoglobin: 13.3 g/dL (ref 13.0–17.0)
O2 Saturation: 100 %
Patient temperature: 98.6
Potassium: 2.7 mmol/L — CL (ref 3.5–5.1)
Sodium: 142 mmol/L (ref 135–145)
TCO2: 29 mmol/L (ref 22–32)
pCO2 arterial: 45.2 mmHg (ref 32.0–48.0)
pH, Arterial: 7.389 (ref 7.350–7.450)
pO2, Arterial: 487 mmHg — ABNORMAL HIGH (ref 83.0–108.0)

## 2020-12-28 LAB — CBC
HCT: 38.4 % — ABNORMAL LOW (ref 39.0–52.0)
HCT: 37 % — ABNORMAL LOW (ref 39.0–52.0)
Hemoglobin: 12.6 g/dL — ABNORMAL LOW (ref 13.0–17.0)
Hemoglobin: 12.3 g/dL — ABNORMAL LOW (ref 13.0–17.0)
MCH: 28.8 pg (ref 26.0–34.0)
MCH: 28.8 pg (ref 26.0–34.0)
MCHC: 32.8 g/dL (ref 30.0–36.0)
MCHC: 33.2 g/dL (ref 30.0–36.0)
MCV: 87.9 fL (ref 80.0–100.0)
MCV: 86.7 fL (ref 80.0–100.0)
Platelets: 149 10*3/uL — ABNORMAL LOW (ref 150–400)
Platelets: 146 10*3/uL — ABNORMAL LOW (ref 150–400)
RBC: 4.37 MIL/uL (ref 4.22–5.81)
RBC: 4.27 MIL/uL (ref 4.22–5.81)
RDW: 12.2 % (ref 11.5–15.5)
RDW: 12.3 % (ref 11.5–15.5)
WBC: 15.6 10*3/uL — ABNORMAL HIGH (ref 4.0–10.5)
WBC: 12.6 10*3/uL — ABNORMAL HIGH (ref 4.0–10.5)
nRBC: 0 % (ref 0.0–0.2)
nRBC: 0 % (ref 0.0–0.2)

## 2020-12-28 LAB — BASIC METABOLIC PANEL
Anion gap: 9 (ref 5–15)
BUN: 13 mg/dL (ref 6–20)
CO2: 25 mmol/L (ref 22–32)
Calcium: 8.7 mg/dL — ABNORMAL LOW (ref 8.9–10.3)
Chloride: 104 mmol/L (ref 98–111)
Creatinine, Ser: 1.02 mg/dL (ref 0.61–1.24)
GFR, Estimated: >60 mL/min (ref >60)
Glucose, Bld: 107 mg/dL — ABNORMAL HIGH (ref 70–99)
Potassium: 3.5 mmol/L (ref 3.5–5.1)
Sodium: 138 mmol/L (ref 135–145)

## 2020-12-28 LAB — PROTIME-INR
INR: 1.1 (ref 0.8–1.2)
Prothrombin Time: 14.3 seconds (ref 11.4–15.2)

## 2020-12-28 LAB — RESP PANEL BY RT-PCR (FLU A&B, COVID) ARPGX2
Influenza A by PCR: NEGATIVE
Influenza B by PCR: NEGATIVE
SARS Coronavirus 2 by RT PCR: POSITIVE — AB

## 2020-12-28 LAB — I-STAT CHEM 8, ED
BUN: 17 mg/dL (ref 6–20)
Calcium, Ion: 1.12 mmol/L — ABNORMAL LOW (ref 1.15–1.40)
Chloride: 105 mmol/L (ref 98–111)
Creatinine, Ser: 1.1 mg/dL (ref 0.61–1.24)
Glucose, Bld: 133 mg/dL — ABNORMAL HIGH (ref 70–99)
HCT: 41 % (ref 39.0–52.0)
Hemoglobin: 13.9 g/dL (ref 13.0–17.0)
Potassium: 2.9 mmol/L — ABNORMAL LOW (ref 3.5–5.1)
Sodium: 144 mmol/L (ref 135–145)
TCO2: 25 mmol/L (ref 22–32)

## 2020-12-28 LAB — URINALYSIS, MICROSCOPIC (REFLEX)

## 2020-12-28 LAB — COMPREHENSIVE METABOLIC PANEL
ALT: 25 U/L (ref 0–44)
AST: 47 U/L — ABNORMAL HIGH (ref 15–41)
Albumin: 3.1 g/dL — ABNORMAL LOW (ref 3.5–5.0)
Alkaline Phosphatase: 82 U/L (ref 38–126)
Anion gap: 11 (ref 5–15)
BUN: 15 mg/dL (ref 6–20)
CO2: 22 mmol/L (ref 22–32)
Calcium: 8 mg/dL — ABNORMAL LOW (ref 8.9–10.3)
Chloride: 104 mmol/L (ref 98–111)
Creatinine, Ser: 1.11 mg/dL (ref 0.61–1.24)
GFR, Estimated: >60 mL/min (ref >60)
Glucose, Bld: 148 mg/dL — ABNORMAL HIGH (ref 70–99)
Potassium: 2.7 mmol/L — CL (ref 3.5–5.1)
Sodium: 137 mmol/L (ref 135–145)
Total Bilirubin: 0.7 mg/dL (ref 0.3–1.2)
Total Protein: 5.4 g/dL — ABNORMAL LOW (ref 6.5–8.1)

## 2020-12-28 LAB — SAMPLE TO BLOOD BANK

## 2020-12-28 LAB — URINALYSIS, ROUTINE W REFLEX MICROSCOPIC
Bilirubin Urine: NEGATIVE
Glucose, UA: NEGATIVE mg/dL
Ketones, ur: NEGATIVE mg/dL
Leukocytes,Ua: NEGATIVE
Nitrite: NEGATIVE
Protein, ur: NEGATIVE mg/dL
Specific Gravity, Urine: <1.005 — ABNORMAL LOW (ref 1.005–1.030)
pH: 5 (ref 5.0–8.0)

## 2020-12-28 LAB — MRSA NEXT GEN BY PCR, NASAL: MRSA by PCR Next Gen: NOT DETECTED

## 2020-12-28 LAB — HIV ANTIBODY (ROUTINE TESTING W REFLEX): HIV Screen 4th Generation wRfx: NONREACTIVE

## 2020-12-28 MED ORDER — PROPOFOL 1000 MG/100ML IV EMUL
0.0000 ug/kg/min | INTRAVENOUS | Status: DC
  Administered 2020-12-28: 50 ug/kg/min via INTRAVENOUS
  Administered 2020-12-28: 40 ug/kg/min via INTRAVENOUS
  Filled 2020-12-28: qty 100

## 2020-12-28 MED ORDER — MORPHINE SULFATE (PF) 2 MG/ML IV SOLN
2.0000 mg | INTRAVENOUS | Status: DC | PRN
  Administered 2020-12-29: 2 mg via INTRAVENOUS
  Filled 2020-12-28: qty 1

## 2020-12-28 MED ORDER — CHLORHEXIDINE GLUCONATE CLOTH 2 % EX PADS
6.0000 | MEDICATED_PAD | Freq: Every day | CUTANEOUS | Status: DC
  Administered 2020-12-28 – 2020-12-30 (×3): 6 via TOPICAL

## 2020-12-28 MED ORDER — FENTANYL CITRATE PF 50 MCG/ML IJ SOSY: 50.0000 ug | PREFILLED_SYRINGE | INTRAMUSCULAR | Status: DC | PRN

## 2020-12-28 MED ORDER — SODIUM CHLORIDE 0.9 % IV BOLUS
1000.0000 mL | Freq: Once | INTRAVENOUS | Status: AC
  Administered 2020-12-28: 1000 mL via INTRAVENOUS

## 2020-12-28 MED ORDER — DOCUSATE SODIUM 50 MG/5ML PO LIQD
100.0000 mg | Freq: Two times a day (BID) | ORAL | Status: DC
  Filled 2020-12-28: qty 10

## 2020-12-28 MED ORDER — ONDANSETRON HCL 4 MG/2ML IJ SOLN: 4.0000 mg | Freq: Four times a day (QID) | INTRAMUSCULAR | Status: DC | PRN

## 2020-12-28 MED ORDER — DOCUSATE SODIUM 100 MG PO CAPS: 100.0000 mg | ORAL_CAPSULE | Freq: Two times a day (BID) | ORAL | Status: DC

## 2020-12-28 MED ORDER — ONDANSETRON 4 MG PO TBDP: 4.0000 mg | ORAL_TABLET | Freq: Four times a day (QID) | ORAL | Status: DC | PRN

## 2020-12-28 MED ORDER — SODIUM CHLORIDE 0.9 % IV SOLN: INTRAVENOUS | Status: DC

## 2020-12-28 MED ORDER — LACTATED RINGERS IV SOLN: INTRAVENOUS | Status: DC

## 2020-12-28 MED ORDER — IOHEXOL 350 MG/ML SOLN
100.0000 mL | Freq: Once | INTRAVENOUS | Status: AC | PRN
  Administered 2020-12-28: 100 mL via INTRAVENOUS

## 2020-12-28 MED ORDER — LORAZEPAM 1 MG PO TABS
1.0000 mg | ORAL_TABLET | ORAL | Status: DC | PRN
  Administered 2020-12-28: 1 mg via ORAL
  Filled 2020-12-28: qty 1

## 2020-12-28 MED ORDER — ENOXAPARIN SODIUM 30 MG/0.3ML IJ SOSY
30.0000 mg | PREFILLED_SYRINGE | Freq: Two times a day (BID) | INTRAMUSCULAR | Status: DC
  Administered 2020-12-29 – 2020-12-30 (×2): 30 mg via SUBCUTANEOUS
  Filled 2020-12-28 (×2): qty 0.3

## 2020-12-28 MED ORDER — OXYCODONE HCL 5 MG PO TABS
5.0000 mg | ORAL_TABLET | Freq: Four times a day (QID) | ORAL | Status: DC | PRN
  Administered 2020-12-28: 5 mg via ORAL
  Filled 2020-12-28: qty 1

## 2020-12-28 MED ORDER — ETOMIDATE 2 MG/ML IV SOLN
20.0000 mg | Freq: Once | INTRAVENOUS | Status: AC
  Administered 2020-12-28: 20 mg via INTRAVENOUS

## 2020-12-28 MED ORDER — PROPOFOL 1000 MG/100ML IV EMUL
INTRAVENOUS | Status: AC
  Filled 2020-12-28: qty 100

## 2020-12-28 MED ORDER — BISACODYL 10 MG RE SUPP: 10.0000 mg | Freq: Every day | RECTAL | Status: DC | PRN

## 2020-12-28 MED ORDER — FENTANYL 2500MCG IN NS 250ML (10MCG/ML) PREMIX INFUSION
0.0000 ug/h | INTRAVENOUS | Status: DC
Start: 2020-12-28 — End: 2020-12-29
  Administered 2020-12-28: 25 ug/h via INTRAVENOUS
  Filled 2020-12-28: qty 250

## 2020-12-28 MED ORDER — LORAZEPAM 2 MG/ML IJ SOLN: 1.0000 mg | INTRAMUSCULAR | Status: DC | PRN

## 2020-12-28 MED ORDER — OXYCODONE HCL 5 MG PO TABS: 5.0000 mg | ORAL_TABLET | Freq: Four times a day (QID) | ORAL | 0 refills | Status: DC | PRN

## 2020-12-28 MED ORDER — HYDRALAZINE HCL 20 MG/ML IJ SOLN: 10.0000 mg | INTRAMUSCULAR | Status: DC | PRN

## 2020-12-28 MED ORDER — TETANUS-DIPHTH-ACELL PERTUSSIS 5-2.5-18.5 LF-MCG/0.5 IM SUSY
0.5000 mL | PREFILLED_SYRINGE | Freq: Once | INTRAMUSCULAR | Status: AC
  Administered 2020-12-28: 0.5 mL via INTRAMUSCULAR
  Filled 2020-12-28: qty 0.5

## 2020-12-28 MED ORDER — ROCURONIUM BROMIDE 50 MG/5ML IV SOLN
100.0000 mg | Freq: Once | INTRAVENOUS | Status: AC
  Administered 2020-12-28: 100 mg via INTRAVENOUS

## 2020-12-28 MED ORDER — POLYETHYLENE GLYCOL 3350 17 G PO PACK
17.0000 g | PACK | Freq: Every day | ORAL | Status: DC
  Filled 2020-12-28: qty 1
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
## 2020-12-28 NOTE — Procedures (Signed)
Extubation Procedure Note  Patient Details:   Name: Ricahrd Schwager DOB: 09-09-1985 MRN: [id number]   Airway Documentation:    Vent end date: 12/28/20 Vent end time: 1155   Evaluation  O2 sats: stable throughout Complications: No apparent complications Patient did tolerate procedure well. Bilateral Breath Sounds: Clear, Diminished   Pt extubated to 4L [place] per MD order. Pt had positive cuff leak prior to extubation. Pt able to use his voice after extubation. RT will continue to monitor.  Guss Bunde 12/28/2020, 11:56 AM

## 2020-12-28 NOTE — Progress Notes (Signed)
RRT transported 4N 20 with RN. Pt vitals were stable throughout.unit RRT was notified and aware.

## 2020-12-28 NOTE — ED Notes (Signed)
Patient transported to CT SCAN . 

## 2020-12-28 NOTE — Progress Notes (Signed)
Dr. Sheliah Hatch was notified of the patients increased pain and agitation and continued inability to mobilize the right arm. Dr. Sheliah Hatch informed me that he would add orders. Awaiting orders at this time. VSS will continue to monitor.
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
## 2020-12-28 NOTE — Progress Notes (Signed)
Orthopedic Tech Progress Note Patient Details:  Gregory Cox 1985-10-17 [id number]  Ortho Devices Type of Ortho Device: Ulna gutter splint Ortho Device/Splint Location: LUE Ortho Device/Splint Interventions: Ordered, Application, Adjustment   Post Interventions Patient Tolerated: Other (comment) Instructions Provided: Other (comment)  [name] 12/28/2020, 3:19 AM
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
## 2020-12-28 NOTE — ED Notes (Signed)
Ortho tech applied left ulna gutter splint .
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
## 2020-12-28 NOTE — Progress Notes (Signed)
[facility] transported pt with RN to CT and back. Vital signs were stable throughout.
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
## 2020-12-28 NOTE — ED Notes (Signed)
All belongings were given to patients mother.

## 2020-12-28 NOTE — Progress Notes (Signed)
Patient mother and sister shared that the patient has suffered with psych issues for some time and has not been compliment with medication. Patient has also been without sleep for a few days. Will continue to monitor.

## 2020-12-28 NOTE — Progress Notes (Signed)
Notified Dr. Sheliah Hatch that pt was very unsteady on his feet.  There were thoughts on discharging pt prior to this.  Also let MD know that he's still not moving the R arm.  Discharge order discontinued per MD.  Will continue to monitor pt.
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
## 2020-12-28 NOTE — ED Notes (Signed)
Patient's wallet with multiple cards and cash amounting to $51.00 given to security office for safekeeping .

## 2020-12-28 NOTE — Progress Notes (Signed)
Patient was extubated w/o issue. Patient was combative and agitated before extubation. Patient is now refusing to answer question or open his eyes. Mother is at the bedside and has been updated. Mother did voice that he can be combative and has issues with bipolar and lack for sleep for days at a time. Patient is resting in bed with eyes closed VSS. Will continue to monitor.

## 2020-12-28 NOTE — ED Notes (Signed)
Purple top blood specimen tube recollected and sent to lab for CBCw/diff.

## 2020-12-29 ENCOUNTER — Inpatient Hospital Stay (HOSPITAL_COMMUNITY): Payer: Medicare Other

## 2020-12-29 LAB — TRIGLYCERIDES: Triglycerides: 69 mg/dL (ref <150)

## 2020-12-29 MED ORDER — POLYETHYLENE GLYCOL 3350 17 G PO PACK
17.0000 g | PACK | Freq: Every day | ORAL | Status: DC
  Administered 2020-12-30: 17 g via ORAL

## 2020-12-29 MED ORDER — GADOBUTROL 1 MMOL/ML IV SOLN
10.0000 mL | Freq: Once | INTRAVENOUS | Status: AC | PRN
  Administered 2020-12-29: 10 mL via INTRAVENOUS

## 2020-12-29 MED ORDER — LORAZEPAM 2 MG/ML IJ SOLN
2.0000 mg | Freq: Once | INTRAMUSCULAR | Status: AC
  Administered 2020-12-29: 2 mg via INTRAVENOUS
  Filled 2020-12-29: qty 1

## 2020-12-29 MED ORDER — DOCUSATE SODIUM 100 MG PO CAPS
100.0000 mg | ORAL_CAPSULE | Freq: Two times a day (BID) | ORAL | Status: DC
  Administered 2020-12-30: 100 mg via ORAL

## 2020-12-29 NOTE — Progress Notes (Signed)
This RN wasted of fentanyl 2554mcg/250ml in the stericycle with Shirley Muscat.

## 2020-12-29 NOTE — Progress Notes (Signed)
Patient returned to the unit from MRI. Noted to still be drowsy from Ativan given before MRI. Sister is still at the bedside. All questions asked were answered with a nod. No c/o pain are noted at this time. VSS will continue to monitor.
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
## 2020-12-29 NOTE — Progress Notes (Signed)
Patient off the unit for MRI. Sister is at the bedside for comfort of the patient. VSS at this time. Will continue to monitor.

## 2020-12-30 MED ORDER — METHOCARBAMOL 500 MG PO TABS: 750.0000 mg | ORAL_TABLET | Freq: Three times a day (TID) | ORAL | Status: DC | PRN

## 2020-12-30 MED ORDER — METHOCARBAMOL 750 MG PO TABS: 750.0000 mg | ORAL_TABLET | Freq: Four times a day (QID) | ORAL | 1 refills | Status: DC

## 2020-12-30 MED ORDER — DOCUSATE SODIUM 100 MG PO CAPS: 100.0000 mg | ORAL_CAPSULE | Freq: Two times a day (BID) | ORAL | 2 refills | Status: DC
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
## 2020-12-30 NOTE — Progress Notes (Signed)
Patient's sister at bedside and is extremely upset because she says not a single Dr has come to her brother's bedside since he has been on our unit. I text paged trauma to let them know of her concerns.
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
## 2020-12-30 NOTE — Progress Notes (Signed)
Orthopedic Tech Progress Note Patient Details:  Gregory Cox 1985/11/11 [id number]  RN was in the room at the time, I asked if she could apply it. She said sure.   Ortho Devices Type of Ortho Device: Shoulder immobilizer Ortho Device/Splint Location: RUE Ortho Device/Splint Interventions: Ordered, Adjustment, Other (comment)   Post Interventions Patient Tolerated: Well Instructions Provided: Care of device  [name] 12/30/2020, 8:58 AM
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
## 2020-12-30 NOTE — TOC Transition Note (Signed)
Transition of Care [facility]) - CM/SW Discharge Note   Patient Details  Name: Javonne Dorko MRN: [id number] Date of Birth: 02-16-1986  [facility] CM/SW Contact:  Glennon Mac, RN Phone Number: 12/30/2020, 4:47 PM   Clinical Narrative: Per MD, patient insistent on discharge home, and sister agrees with plan.  CIR recommended by PT/OT, though patient/family are refusing inpatient program.  They prefer outpatient follow-up; referrals made to [facility] for continued therapy services.  Family states they have all needed DME at home.    Final next level of care: OP Rehab Barriers to Discharge: Barriers Resolved                       Discharge Plan and Services   Discharge Planning Services: CM Consult                                 Social Determinants of Health (SDOH) Interventions     Readmission Risk Interventions No flowsheet data found.  Quintella Baton, RN, BSN  Trauma/Neuro ICU Case Manager [phone]
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
## 2020-12-30 NOTE — Progress Notes (Signed)
Inpatient Rehab Admissions Coordinator:   I requested MD review this Pt. For medical necessity for CIR admission. I will update chart once I receive a response.   Megan Salon, MS, CCC-SLP Rehab Admissions Coordinator  [phone] (celll) [phone] (office)

## 2021-01-03 DIAGNOSIS — S79911S Unspecified injury of right hip, sequela: Secondary | ICD-10-CM | POA: Diagnosis not present

## 2021-01-03 DIAGNOSIS — S2241XG Multiple fractures of ribs, right side, subsequent encounter for fracture with delayed healing: Secondary | ICD-10-CM | POA: Diagnosis not present

## 2021-01-05 ENCOUNTER — Encounter (HOSPITAL_COMMUNITY): Payer: Self-pay | Admitting: *Deleted

## 2021-01-05 ENCOUNTER — Other Ambulatory Visit: Payer: Self-pay

## 2021-01-05 ENCOUNTER — Emergency Department (HOSPITAL_COMMUNITY): Payer: No Typology Code available for payment source

## 2021-01-05 ENCOUNTER — Inpatient Hospital Stay (HOSPITAL_COMMUNITY)
Admission: EM | Admit: 2021-01-05 | Discharge: 2021-01-06 | DRG: 950 | Discharge status: Against Medical Advice | Payer: No Typology Code available for payment source | Attending: Family Medicine | Admitting: Family Medicine

## 2021-01-05 DIAGNOSIS — Y92410 Unspecified street and highway as the place of occurrence of the external cause: Secondary | ICD-10-CM | POA: Diagnosis not present

## 2021-01-05 DIAGNOSIS — J45909 Unspecified asthma, uncomplicated: Secondary | ICD-10-CM | POA: Diagnosis present

## 2021-01-05 DIAGNOSIS — S32009D Unspecified fracture of unspecified lumbar vertebra, subsequent encounter for fracture with routine healing: Secondary | ICD-10-CM | POA: Diagnosis not present

## 2021-01-05 DIAGNOSIS — S2241XA Multiple fractures of ribs, right side, initial encounter for closed fracture: Secondary | ICD-10-CM | POA: Diagnosis not present

## 2021-01-05 DIAGNOSIS — Z91013 Allergy to seafood: Secondary | ICD-10-CM

## 2021-01-05 DIAGNOSIS — R52 Pain, unspecified: Secondary | ICD-10-CM | POA: Diagnosis not present

## 2021-01-05 DIAGNOSIS — Z79891 Long term (current) use of opiate analgesic: Secondary | ICD-10-CM | POA: Diagnosis not present

## 2021-01-05 DIAGNOSIS — R911 Solitary pulmonary nodule: Secondary | ICD-10-CM | POA: Diagnosis not present

## 2021-01-05 DIAGNOSIS — Z880 Allergy status to penicillin: Secondary | ICD-10-CM

## 2021-01-05 DIAGNOSIS — S2241XD Multiple fractures of ribs, right side, subsequent encounter for fracture with routine healing: Secondary | ICD-10-CM | POA: Diagnosis not present

## 2021-01-05 DIAGNOSIS — Z20822 Contact with and (suspected) exposure to covid-19: Secondary | ICD-10-CM | POA: Diagnosis present

## 2021-01-05 DIAGNOSIS — S301XXA Contusion of abdominal wall, initial encounter: Secondary | ICD-10-CM

## 2021-01-05 DIAGNOSIS — Z886 Allergy status to analgesic agent status: Secondary | ICD-10-CM

## 2021-01-05 DIAGNOSIS — S32018A Other fracture of first lumbar vertebra, initial encounter for closed fracture: Secondary | ICD-10-CM | POA: Diagnosis not present

## 2021-01-05 DIAGNOSIS — S143XXD Injury of brachial plexus, subsequent encounter: Secondary | ICD-10-CM | POA: Diagnosis not present

## 2021-01-05 DIAGNOSIS — K838 Other specified diseases of biliary tract: Secondary | ICD-10-CM | POA: Diagnosis not present

## 2021-01-05 DIAGNOSIS — S43214A Anterior dislocation of right sternoclavicular joint, initial encounter: Secondary | ICD-10-CM | POA: Diagnosis not present

## 2021-01-05 DIAGNOSIS — Z79899 Other long term (current) drug therapy: Secondary | ICD-10-CM | POA: Diagnosis not present

## 2021-01-05 DIAGNOSIS — Z041 Encounter for examination and observation following transport accident: Secondary | ICD-10-CM | POA: Diagnosis not present

## 2021-01-05 DIAGNOSIS — S32028A Other fracture of second lumbar vertebra, initial encounter for closed fracture: Secondary | ICD-10-CM | POA: Diagnosis not present

## 2021-01-05 DIAGNOSIS — S4991XA Unspecified injury of right shoulder and upper arm, initial encounter: Secondary | ICD-10-CM | POA: Diagnosis not present

## 2021-01-05 DIAGNOSIS — R188 Other ascites: Secondary | ICD-10-CM | POA: Diagnosis not present

## 2021-01-05 DIAGNOSIS — J439 Emphysema, unspecified: Secondary | ICD-10-CM | POA: Diagnosis not present

## 2021-01-05 DIAGNOSIS — S62617A Displaced fracture of proximal phalanx of left little finger, initial encounter for closed fracture: Secondary | ICD-10-CM | POA: Diagnosis not present

## 2021-01-05 DIAGNOSIS — S32038A Other fracture of third lumbar vertebra, initial encounter for closed fracture: Secondary | ICD-10-CM | POA: Diagnosis not present

## 2021-01-05 DIAGNOSIS — S6292XA Unspecified fracture of left wrist and hand, initial encounter for closed fracture: Secondary | ICD-10-CM | POA: Diagnosis not present

## 2021-01-05 DIAGNOSIS — R079 Chest pain, unspecified: Secondary | ICD-10-CM | POA: Diagnosis not present

## 2021-01-05 DIAGNOSIS — R0602 Shortness of breath: Secondary | ICD-10-CM | POA: Diagnosis not present

## 2021-01-05 DIAGNOSIS — S32048A Other fracture of fourth lumbar vertebra, initial encounter for closed fracture: Secondary | ICD-10-CM | POA: Diagnosis not present

## 2021-01-05 DIAGNOSIS — F17210 Nicotine dependence, cigarettes, uncomplicated: Secondary | ICD-10-CM | POA: Diagnosis present

## 2021-01-05 LAB — CBC WITH DIFFERENTIAL/PLATELET
Basophils Absolute: 0 10*3/uL (ref 0.0–0.1)
Basophils Relative: 0 %
Eosinophils Absolute: 0.1 10*3/uL (ref 0.0–0.5)
Eosinophils Relative: 1 %
HCT: 39.8 % (ref 39.0–52.0)
Hemoglobin: 13.1 g/dL (ref 13.0–17.0)
Lymphocytes Relative: 17 %
Lymphs Abs: 2 10*3/uL (ref 0.7–4.0)
MCH: 28.6 pg (ref 26.0–34.0)
MCHC: 32.9 g/dL (ref 30.0–36.0)
MCV: 86.9 fL (ref 80.0–100.0)
Metamyelocytes Relative: 4 %
Monocytes Absolute: 0.6 10*3/uL (ref 0.1–1.0)
Monocytes Relative: 5 %
Neutro Abs: 8.6 10*3/uL — ABNORMAL HIGH (ref 1.7–7.7)
Neutrophils Relative %: 73 %
Platelets: 319 10*3/uL (ref 150–400)
RBC: 4.58 MIL/uL (ref 4.22–5.81)
RDW: 12.9 % (ref 11.5–15.5)
WBC: 11.8 10*3/uL — ABNORMAL HIGH (ref 4.0–10.5)
nRBC: 0 % (ref 0.0–0.2)

## 2021-01-05 LAB — COMPREHENSIVE METABOLIC PANEL
ALT: 39 U/L (ref 0–44)
AST: 34 U/L (ref 15–41)
Albumin: 3.8 g/dL (ref 3.5–5.0)
Alkaline Phosphatase: 89 U/L (ref 38–126)
Anion gap: 8 (ref 5–15)
BUN: 13 mg/dL (ref 6–20)
CO2: 29 mmol/L (ref 22–32)
Calcium: 8.5 mg/dL — ABNORMAL LOW (ref 8.9–10.3)
Chloride: 98 mmol/L (ref 98–111)
Creatinine, Ser: 0.8 mg/dL (ref 0.61–1.24)
GFR, Estimated: >60 mL/min (ref >60)
Glucose, Bld: 97 mg/dL (ref 70–99)
Potassium: 3.1 mmol/L — ABNORMAL LOW (ref 3.5–5.1)
Sodium: 135 mmol/L (ref 135–145)
Total Bilirubin: 1.2 mg/dL (ref 0.3–1.2)
Total Protein: 7.5 g/dL (ref 6.5–8.1)

## 2021-01-05 LAB — URINALYSIS, ROUTINE W REFLEX MICROSCOPIC
Bilirubin Urine: NEGATIVE
Glucose, UA: NEGATIVE mg/dL
Hgb urine dipstick: NEGATIVE
Ketones, ur: NEGATIVE mg/dL
Leukocytes,Ua: NEGATIVE
Nitrite: NEGATIVE
Protein, ur: NEGATIVE mg/dL
Specific Gravity, Urine: 1.025 (ref 1.005–1.030)
pH: 6 (ref 5.0–8.0)

## 2021-01-05 LAB — RESP PANEL BY RT-PCR (FLU A&B, COVID) ARPGX2
Influenza A by PCR: NEGATIVE
Influenza B by PCR: NEGATIVE
SARS Coronavirus 2 by RT PCR: NEGATIVE

## 2021-01-05 MED ORDER — OXYCODONE HCL 5 MG PO TABS: 5.0000 mg | ORAL_TABLET | Freq: Four times a day (QID) | ORAL | Status: DC | PRN

## 2021-01-05 MED ORDER — ACETAMINOPHEN 325 MG PO TABS: 650.0000 mg | ORAL_TABLET | Freq: Four times a day (QID) | ORAL | Status: DC | PRN

## 2021-01-05 MED ORDER — POTASSIUM CHLORIDE 20 MEQ PO PACK
40.0000 meq | PACK | Freq: Once | ORAL | Status: AC
  Administered 2021-01-05: 40 meq via ORAL
  Filled 2021-01-05: qty 2

## 2021-01-05 MED ORDER — METHOCARBAMOL 500 MG PO TABS: 750.0000 mg | ORAL_TABLET | Freq: Four times a day (QID) | ORAL | Status: DC

## 2021-01-05 MED ORDER — TRAZODONE HCL 50 MG PO TABS
100.0000 mg | ORAL_TABLET | Freq: Every day | ORAL | Status: DC
  Administered 2021-01-05: 100 mg via ORAL
  Filled 2021-01-05: qty 2

## 2021-01-05 MED ORDER — ONDANSETRON HCL 4 MG/2ML IJ SOLN: 4.0000 mg | Freq: Four times a day (QID) | INTRAMUSCULAR | Status: DC | PRN

## 2021-01-05 MED ORDER — MORPHINE SULFATE (PF) 4 MG/ML IV SOLN: 4.0000 mg | INTRAVENOUS | Status: DC | PRN

## 2021-01-05 MED ORDER — ACETAMINOPHEN 650 MG RE SUPP: 650.0000 mg | Freq: Four times a day (QID) | RECTAL | Status: DC | PRN

## 2021-01-05 MED ORDER — ONDANSETRON HCL 4 MG/2ML IJ SOLN
4.0000 mg | Freq: Once | INTRAMUSCULAR | Status: AC
  Administered 2021-01-05: 4 mg via INTRAVENOUS
  Filled 2021-01-05: qty 2

## 2021-01-05 MED ORDER — NICOTINE 21 MG/24HR TD PT24
21.0000 mg | MEDICATED_PATCH | Freq: Every day | TRANSDERMAL | Status: DC
  Filled 2021-01-05: qty 1

## 2021-01-05 MED ORDER — ONDANSETRON HCL 4 MG PO TABS: 4.0000 mg | ORAL_TABLET | Freq: Four times a day (QID) | ORAL | Status: DC | PRN

## 2021-01-05 MED ORDER — MORPHINE SULFATE (PF) 4 MG/ML IV SOLN
4.0000 mg | Freq: Once | INTRAVENOUS | Status: AC
  Administered 2021-01-05: 4 mg via INTRAVENOUS
  Filled 2021-01-05: qty 1

## 2021-01-05 MED ORDER — IOHEXOL 350 MG/ML SOLN
85.0000 mL | Freq: Once | INTRAVENOUS | Status: AC | PRN
  Administered 2021-01-05: 85 mL via INTRAVENOUS

## 2021-01-05 NOTE — ED Notes (Signed)
Patient transported to CT 
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
## 2021-01-05 NOTE — ED Notes (Signed)
Mother Osvaldo Human informed bed request placed for [facility]. Per family they refuse pt transfer to [facility]. Their wish is for the patient to either stay here at [facility] or be transferred to [facility]. Mother stated "if they can't figure out a way to transfer him or keep him, I'll take him to [facility] myself." Secure message sent to admitting provider: Asia Zierle-Ghosh. Pending response. Family call back number in chart.
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
## 2021-01-06 DIAGNOSIS — S71001A Unspecified open wound, right hip, initial encounter: Secondary | ICD-10-CM | POA: Diagnosis not present

## 2021-01-06 DIAGNOSIS — S43204A Unspecified dislocation of right sternoclavicular joint, initial encounter: Secondary | ICD-10-CM | POA: Diagnosis present

## 2021-01-06 DIAGNOSIS — S32048A Other fracture of fourth lumbar vertebra, initial encounter for closed fracture: Secondary | ICD-10-CM | POA: Diagnosis not present

## 2021-01-06 DIAGNOSIS — S32019A Unspecified fracture of first lumbar vertebra, initial encounter for closed fracture: Secondary | ICD-10-CM | POA: Diagnosis present

## 2021-01-06 DIAGNOSIS — S32029A Unspecified fracture of second lumbar vertebra, initial encounter for closed fracture: Secondary | ICD-10-CM | POA: Diagnosis present

## 2021-01-06 DIAGNOSIS — S62617A Displaced fracture of proximal phalanx of left little finger, initial encounter for closed fracture: Secondary | ICD-10-CM | POA: Diagnosis not present

## 2021-01-06 DIAGNOSIS — S143XXA Injury of brachial plexus, initial encounter: Secondary | ICD-10-CM | POA: Diagnosis present

## 2021-01-06 DIAGNOSIS — S32028A Other fracture of second lumbar vertebra, initial encounter for closed fracture: Secondary | ICD-10-CM | POA: Diagnosis not present

## 2021-01-06 DIAGNOSIS — F17210 Nicotine dependence, cigarettes, uncomplicated: Secondary | ICD-10-CM | POA: Diagnosis present

## 2021-01-06 DIAGNOSIS — S43214A Anterior dislocation of right sternoclavicular joint, initial encounter: Secondary | ICD-10-CM | POA: Diagnosis not present

## 2021-01-06 DIAGNOSIS — T1490XA Injury, unspecified, initial encounter: Secondary | ICD-10-CM | POA: Insufficient documentation

## 2021-01-06 DIAGNOSIS — S2241XA Multiple fractures of ribs, right side, initial encounter for closed fracture: Secondary | ICD-10-CM | POA: Diagnosis not present

## 2021-01-06 DIAGNOSIS — S32018A Other fracture of first lumbar vertebra, initial encounter for closed fracture: Secondary | ICD-10-CM | POA: Diagnosis not present

## 2021-01-06 DIAGNOSIS — S32049A Unspecified fracture of fourth lumbar vertebra, initial encounter for closed fracture: Secondary | ICD-10-CM | POA: Diagnosis present

## 2021-01-06 DIAGNOSIS — S32039A Unspecified fracture of third lumbar vertebra, initial encounter for closed fracture: Secondary | ICD-10-CM | POA: Diagnosis not present

## 2021-01-06 DIAGNOSIS — S301XXA Contusion of abdominal wall, initial encounter: Secondary | ICD-10-CM | POA: Diagnosis present

## 2021-01-06 DIAGNOSIS — S62647A Nondisplaced fracture of proximal phalanx of left little finger, initial encounter for closed fracture: Secondary | ICD-10-CM | POA: Insufficient documentation

## 2021-01-06 DIAGNOSIS — S32038A Other fracture of third lumbar vertebra, initial encounter for closed fracture: Secondary | ICD-10-CM | POA: Diagnosis not present

## 2021-01-06 DIAGNOSIS — Y92410 Unspecified street and highway as the place of occurrence of the external cause: Secondary | ICD-10-CM | POA: Diagnosis not present

## 2021-01-06 DIAGNOSIS — F319 Bipolar disorder, unspecified: Secondary | ICD-10-CM | POA: Diagnosis present

## 2021-01-06 NOTE — Progress Notes (Signed)
Mr. Pesta is a 35 year old male who presents for worsening pain and bruising in his right flank. Patient would open his eyes and look at me, but would not speak to answer questions. My impression was that it was behavioral, and not a mental status change. Patient did have tenderness over his right flank. Patient had recently signed out AMA from [facility] after an admission under the trauma service. It was discussed with the family that patient would be transferred back to [facility], so the trauma service could follow him. The mother was being aggressive at the front desk, so I was asked to speak with her. I discussed the findings from the previous hospitalization, the findings from today, and the benefits of being admitted to [facility]. I also advised that staying at [facility] would not an option for him. Family decided to take him AMA and go to [facility], after a phone call with the daughter - in which she was very rude to me. I told them that with his condition, if he decompensates outside of a tertiary center, the results would be catastrophic, including but not limited to possible permanent disability and death. Family reports understanding, and reports that they will take him to [facility].

## 2021-01-07 ENCOUNTER — Ambulatory Visit: Payer: Medicare Other | Admitting: Physical Therapy

## 2021-01-07 ENCOUNTER — Encounter: Payer: Medicare Other | Admitting: Occupational Therapy

## 2021-01-13 DIAGNOSIS — G540 Brachial plexus disorders: Secondary | ICD-10-CM | POA: Diagnosis not present
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
## 2021-01-15 DIAGNOSIS — S301XXD Contusion of abdominal wall, subsequent encounter: Secondary | ICD-10-CM | POA: Diagnosis not present

## 2021-01-15 DIAGNOSIS — Z5189 Encounter for other specified aftercare: Secondary | ICD-10-CM | POA: Diagnosis not present

## 2021-01-17 DIAGNOSIS — Z4800 Encounter for change or removal of nonsurgical wound dressing: Secondary | ICD-10-CM | POA: Diagnosis not present

## 2021-01-21 DIAGNOSIS — Z5189 Encounter for other specified aftercare: Secondary | ICD-10-CM | POA: Diagnosis not present

## 2021-01-21 DIAGNOSIS — Z4689 Encounter for fitting and adjustment of other specified devices: Secondary | ICD-10-CM | POA: Diagnosis not present

## 2021-01-21 DIAGNOSIS — T1490XA Injury, unspecified, initial encounter: Secondary | ICD-10-CM | POA: Diagnosis not present

## 2021-01-24 DIAGNOSIS — Z4800 Encounter for change or removal of nonsurgical wound dressing: Secondary | ICD-10-CM | POA: Diagnosis not present

## 2021-01-28 DIAGNOSIS — S31000D Unspecified open wound of lower back and pelvis without penetration into retroperitoneum, subsequent encounter: Secondary | ICD-10-CM | POA: Diagnosis not present

## 2021-01-28 DIAGNOSIS — S31109D Unspecified open wound of abdominal wall, unspecified quadrant without penetration into peritoneal cavity, subsequent encounter: Secondary | ICD-10-CM | POA: Diagnosis not present

## 2021-02-03 DIAGNOSIS — G540 Brachial plexus disorders: Secondary | ICD-10-CM | POA: Diagnosis not present

## 2021-02-03 DIAGNOSIS — S43204A Unspecified dislocation of right sternoclavicular joint, initial encounter: Secondary | ICD-10-CM | POA: Diagnosis not present

## 2021-02-11 DIAGNOSIS — S3992XD Unspecified injury of lower back, subsequent encounter: Secondary | ICD-10-CM | POA: Diagnosis not present

## 2021-02-11 DIAGNOSIS — Z5189 Encounter for other specified aftercare: Secondary | ICD-10-CM | POA: Diagnosis not present

## 2021-02-12 DIAGNOSIS — G540 Brachial plexus disorders: Secondary | ICD-10-CM | POA: Diagnosis not present

## 2021-02-25 DIAGNOSIS — S31109D Unspecified open wound of abdominal wall, unspecified quadrant without penetration into peritoneal cavity, subsequent encounter: Secondary | ICD-10-CM | POA: Diagnosis not present

## 2021-02-25 DIAGNOSIS — Z5189 Encounter for other specified aftercare: Secondary | ICD-10-CM | POA: Diagnosis not present

## 2021-03-03 DIAGNOSIS — R0602 Shortness of breath: Secondary | ICD-10-CM | POA: Diagnosis not present

## 2021-03-03 DIAGNOSIS — R070 Pain in throat: Secondary | ICD-10-CM | POA: Diagnosis not present

## 2021-03-03 DIAGNOSIS — M25511 Pain in right shoulder: Secondary | ICD-10-CM | POA: Diagnosis not present

## 2021-03-25 DIAGNOSIS — G540 Brachial plexus disorders: Secondary | ICD-10-CM | POA: Diagnosis not present

## 2021-03-26 DIAGNOSIS — G540 Brachial plexus disorders: Secondary | ICD-10-CM | POA: Diagnosis not present

## 2021-03-26 DIAGNOSIS — S143XXD Injury of brachial plexus, subsequent encounter: Secondary | ICD-10-CM | POA: Diagnosis not present

## 2021-05-01 DIAGNOSIS — G540 Brachial plexus disorders: Secondary | ICD-10-CM | POA: Diagnosis not present

## 2021-05-01 DIAGNOSIS — S143XXA Injury of brachial plexus, initial encounter: Secondary | ICD-10-CM | POA: Diagnosis not present

## 2021-05-01 DIAGNOSIS — S142XXA Injury of nerve root of cervical spine, initial encounter: Secondary | ICD-10-CM | POA: Diagnosis not present

## 2021-05-14 DIAGNOSIS — Z886 Allergy status to analgesic agent status: Secondary | ICD-10-CM | POA: Diagnosis not present

## 2021-05-14 DIAGNOSIS — Z4802 Encounter for removal of sutures: Secondary | ICD-10-CM | POA: Diagnosis not present

## 2021-05-14 DIAGNOSIS — Z880 Allergy status to penicillin: Secondary | ICD-10-CM | POA: Diagnosis not present

## 2021-05-14 DIAGNOSIS — S143XXD Injury of brachial plexus, subsequent encounter: Secondary | ICD-10-CM | POA: Diagnosis not present

## 2021-05-30 DIAGNOSIS — M25611 Stiffness of right shoulder, not elsewhere classified: Secondary | ICD-10-CM | POA: Diagnosis not present

## 2021-05-30 DIAGNOSIS — S143XXS Injury of brachial plexus, sequela: Secondary | ICD-10-CM | POA: Diagnosis not present

## 2021-05-30 DIAGNOSIS — Z789 Other specified health status: Secondary | ICD-10-CM | POA: Diagnosis not present

## 2021-05-30 DIAGNOSIS — M25631 Stiffness of right wrist, not elsewhere classified: Secondary | ICD-10-CM | POA: Diagnosis not present

## 2021-05-30 DIAGNOSIS — R29898 Other symptoms and signs involving the musculoskeletal system: Secondary | ICD-10-CM | POA: Diagnosis not present

## 2021-06-04 DIAGNOSIS — S143XXS Injury of brachial plexus, sequela: Secondary | ICD-10-CM | POA: Diagnosis not present

## 2021-06-04 DIAGNOSIS — M25611 Stiffness of right shoulder, not elsewhere classified: Secondary | ICD-10-CM | POA: Diagnosis not present

## 2021-06-04 DIAGNOSIS — Z789 Other specified health status: Secondary | ICD-10-CM | POA: Diagnosis not present

## 2021-06-04 DIAGNOSIS — M25631 Stiffness of right wrist, not elsewhere classified: Secondary | ICD-10-CM | POA: Diagnosis not present

## 2021-06-04 DIAGNOSIS — R29898 Other symptoms and signs involving the musculoskeletal system: Secondary | ICD-10-CM | POA: Diagnosis not present

## 2021-06-11 DIAGNOSIS — S143XXS Injury of brachial plexus, sequela: Secondary | ICD-10-CM | POA: Diagnosis not present

## 2021-06-11 DIAGNOSIS — Z4789 Encounter for other orthopedic aftercare: Secondary | ICD-10-CM | POA: Diagnosis not present

## 2021-06-12 DIAGNOSIS — Z789 Other specified health status: Secondary | ICD-10-CM | POA: Diagnosis not present

## 2021-06-12 DIAGNOSIS — M25611 Stiffness of right shoulder, not elsewhere classified: Secondary | ICD-10-CM | POA: Diagnosis not present

## 2021-06-12 DIAGNOSIS — R29898 Other symptoms and signs involving the musculoskeletal system: Secondary | ICD-10-CM | POA: Diagnosis not present

## 2021-06-12 DIAGNOSIS — M25631 Stiffness of right wrist, not elsewhere classified: Secondary | ICD-10-CM | POA: Diagnosis not present

## 2021-06-12 DIAGNOSIS — S143XXS Injury of brachial plexus, sequela: Secondary | ICD-10-CM | POA: Diagnosis not present

## 2021-06-13 DIAGNOSIS — R29898 Other symptoms and signs involving the musculoskeletal system: Secondary | ICD-10-CM | POA: Diagnosis not present

## 2021-06-13 DIAGNOSIS — Z789 Other specified health status: Secondary | ICD-10-CM | POA: Diagnosis not present

## 2021-06-13 DIAGNOSIS — M25611 Stiffness of right shoulder, not elsewhere classified: Secondary | ICD-10-CM | POA: Diagnosis not present

## 2021-06-13 DIAGNOSIS — S143XXS Injury of brachial plexus, sequela: Secondary | ICD-10-CM | POA: Diagnosis not present

## 2021-06-13 DIAGNOSIS — M25631 Stiffness of right wrist, not elsewhere classified: Secondary | ICD-10-CM | POA: Diagnosis not present

## 2021-06-18 DIAGNOSIS — S143XXS Injury of brachial plexus, sequela: Secondary | ICD-10-CM | POA: Diagnosis not present

## 2021-06-18 DIAGNOSIS — R29898 Other symptoms and signs involving the musculoskeletal system: Secondary | ICD-10-CM | POA: Diagnosis not present

## 2021-06-18 DIAGNOSIS — Z789 Other specified health status: Secondary | ICD-10-CM | POA: Diagnosis not present

## 2021-06-18 DIAGNOSIS — M25631 Stiffness of right wrist, not elsewhere classified: Secondary | ICD-10-CM | POA: Diagnosis not present

## 2021-06-18 DIAGNOSIS — M25611 Stiffness of right shoulder, not elsewhere classified: Secondary | ICD-10-CM | POA: Diagnosis not present

## 2021-06-20 DIAGNOSIS — R29898 Other symptoms and signs involving the musculoskeletal system: Secondary | ICD-10-CM | POA: Diagnosis not present

## 2021-06-20 DIAGNOSIS — Z789 Other specified health status: Secondary | ICD-10-CM | POA: Diagnosis not present

## 2021-06-20 DIAGNOSIS — S143XXS Injury of brachial plexus, sequela: Secondary | ICD-10-CM | POA: Diagnosis not present

## 2021-06-20 DIAGNOSIS — M25611 Stiffness of right shoulder, not elsewhere classified: Secondary | ICD-10-CM | POA: Diagnosis not present

## 2021-06-20 DIAGNOSIS — M25631 Stiffness of right wrist, not elsewhere classified: Secondary | ICD-10-CM | POA: Diagnosis not present

## 2021-06-25 DIAGNOSIS — M25611 Stiffness of right shoulder, not elsewhere classified: Secondary | ICD-10-CM | POA: Diagnosis not present

## 2021-06-25 DIAGNOSIS — S143XXS Injury of brachial plexus, sequela: Secondary | ICD-10-CM | POA: Diagnosis not present

## 2021-06-25 DIAGNOSIS — M25631 Stiffness of right wrist, not elsewhere classified: Secondary | ICD-10-CM | POA: Diagnosis not present

## 2021-06-25 DIAGNOSIS — R29898 Other symptoms and signs involving the musculoskeletal system: Secondary | ICD-10-CM | POA: Diagnosis not present

## 2021-06-25 DIAGNOSIS — Z789 Other specified health status: Secondary | ICD-10-CM | POA: Diagnosis not present

## 2021-06-27 DIAGNOSIS — M25631 Stiffness of right wrist, not elsewhere classified: Secondary | ICD-10-CM | POA: Diagnosis not present

## 2021-06-27 DIAGNOSIS — M25611 Stiffness of right shoulder, not elsewhere classified: Secondary | ICD-10-CM | POA: Diagnosis not present

## 2021-06-27 DIAGNOSIS — Z789 Other specified health status: Secondary | ICD-10-CM | POA: Diagnosis not present

## 2021-06-27 DIAGNOSIS — S143XXS Injury of brachial plexus, sequela: Secondary | ICD-10-CM | POA: Diagnosis not present

## 2021-06-27 DIAGNOSIS — R29898 Other symptoms and signs involving the musculoskeletal system: Secondary | ICD-10-CM | POA: Diagnosis not present

## 2021-07-02 DIAGNOSIS — Z789 Other specified health status: Secondary | ICD-10-CM | POA: Diagnosis not present

## 2021-07-02 DIAGNOSIS — S143XXS Injury of brachial plexus, sequela: Secondary | ICD-10-CM | POA: Diagnosis not present

## 2021-07-02 DIAGNOSIS — M25611 Stiffness of right shoulder, not elsewhere classified: Secondary | ICD-10-CM | POA: Diagnosis not present

## 2021-07-02 DIAGNOSIS — R29898 Other symptoms and signs involving the musculoskeletal system: Secondary | ICD-10-CM | POA: Diagnosis not present

## 2021-07-02 DIAGNOSIS — M25631 Stiffness of right wrist, not elsewhere classified: Secondary | ICD-10-CM | POA: Diagnosis not present

## 2021-07-03 ENCOUNTER — Other Ambulatory Visit: Payer: Self-pay

## 2021-07-03 ENCOUNTER — Ambulatory Visit (INDEPENDENT_AMBULATORY_CARE_PROVIDER_SITE_OTHER): Payer: Medicare Other | Admitting: Family Medicine

## 2021-07-03 DIAGNOSIS — F17200 Nicotine dependence, unspecified, uncomplicated: Secondary | ICD-10-CM | POA: Insufficient documentation

## 2021-07-03 DIAGNOSIS — F319 Bipolar disorder, unspecified: Secondary | ICD-10-CM | POA: Insufficient documentation

## 2021-07-03 DIAGNOSIS — S143XXS Injury of brachial plexus, sequela: Secondary | ICD-10-CM

## 2021-07-03 DIAGNOSIS — S143XXA Injury of brachial plexus, initial encounter: Secondary | ICD-10-CM | POA: Insufficient documentation
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
## 2021-07-03 NOTE — Patient Instructions (Signed)
Continue close follow up with PT and Orthopedics. ? ?Follow up in ~ 6 months. ? ?Take care ? ?Dr. Adriana Simas  ?
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
## 2021-07-04 ENCOUNTER — Telehealth: Payer: Self-pay

## 2021-07-04 DIAGNOSIS — S143XXS Injury of brachial plexus, sequela: Secondary | ICD-10-CM | POA: Diagnosis not present

## 2021-07-04 DIAGNOSIS — Z789 Other specified health status: Secondary | ICD-10-CM | POA: Diagnosis not present

## 2021-07-04 DIAGNOSIS — R29898 Other symptoms and signs involving the musculoskeletal system: Secondary | ICD-10-CM | POA: Diagnosis not present

## 2021-07-04 DIAGNOSIS — M25611 Stiffness of right shoulder, not elsewhere classified: Secondary | ICD-10-CM | POA: Diagnosis not present

## 2021-07-04 DIAGNOSIS — M25631 Stiffness of right wrist, not elsewhere classified: Secondary | ICD-10-CM | POA: Diagnosis not present

## 2021-07-04 NOTE — Telephone Encounter (Signed)
Pt contacted and informed that form is ready for pickup. Form up front  ?

## 2021-07-04 NOTE — Telephone Encounter (Signed)
Social Worker dropped form back off for pt needing to be completed. Social worker states that existing conditions needed to be place on new form that pcp filled out at pt's appt yesterday (3/16). Social worker stated that pcp could just list conditions from previous form the new form. Please call pt when form is available to pick up to return to social services. ? ?Cb#: [phone] ?

## 2021-07-09 DIAGNOSIS — S143XXS Injury of brachial plexus, sequela: Secondary | ICD-10-CM | POA: Diagnosis not present

## 2021-07-09 DIAGNOSIS — R29898 Other symptoms and signs involving the musculoskeletal system: Secondary | ICD-10-CM | POA: Diagnosis not present

## 2021-07-09 DIAGNOSIS — M25611 Stiffness of right shoulder, not elsewhere classified: Secondary | ICD-10-CM | POA: Diagnosis not present

## 2021-07-09 DIAGNOSIS — Z789 Other specified health status: Secondary | ICD-10-CM | POA: Diagnosis not present

## 2021-07-09 DIAGNOSIS — M25631 Stiffness of right wrist, not elsewhere classified: Secondary | ICD-10-CM | POA: Diagnosis not present

## 2021-07-18 DIAGNOSIS — R29898 Other symptoms and signs involving the musculoskeletal system: Secondary | ICD-10-CM | POA: Diagnosis not present

## 2021-07-18 DIAGNOSIS — M25611 Stiffness of right shoulder, not elsewhere classified: Secondary | ICD-10-CM | POA: Diagnosis not present

## 2021-07-18 DIAGNOSIS — S143XXS Injury of brachial plexus, sequela: Secondary | ICD-10-CM | POA: Diagnosis not present

## 2021-07-18 DIAGNOSIS — Z789 Other specified health status: Secondary | ICD-10-CM | POA: Diagnosis not present

## 2021-07-18 DIAGNOSIS — M25631 Stiffness of right wrist, not elsewhere classified: Secondary | ICD-10-CM | POA: Diagnosis not present

## 2021-08-22 DIAGNOSIS — M25631 Stiffness of right wrist, not elsewhere classified: Secondary | ICD-10-CM | POA: Diagnosis not present

## 2021-08-22 DIAGNOSIS — S143XXS Injury of brachial plexus, sequela: Secondary | ICD-10-CM | POA: Diagnosis not present

## 2021-08-22 DIAGNOSIS — R29898 Other symptoms and signs involving the musculoskeletal system: Secondary | ICD-10-CM | POA: Diagnosis not present

## 2021-08-22 DIAGNOSIS — Z789 Other specified health status: Secondary | ICD-10-CM | POA: Diagnosis not present

## 2021-08-22 DIAGNOSIS — M25611 Stiffness of right shoulder, not elsewhere classified: Secondary | ICD-10-CM | POA: Diagnosis not present

## 2021-08-25 DIAGNOSIS — G588 Other specified mononeuropathies: Secondary | ICD-10-CM | POA: Diagnosis not present

## 2021-08-25 DIAGNOSIS — S143XXS Injury of brachial plexus, sequela: Secondary | ICD-10-CM | POA: Diagnosis not present

## 2021-08-25 DIAGNOSIS — N2889 Other specified disorders of kidney and ureter: Secondary | ICD-10-CM | POA: Diagnosis not present

## 2021-08-25 DIAGNOSIS — Z79899 Other long term (current) drug therapy: Secondary | ICD-10-CM | POA: Diagnosis not present

## 2021-08-25 DIAGNOSIS — M792 Neuralgia and neuritis, unspecified: Secondary | ICD-10-CM | POA: Diagnosis not present

## 2021-08-25 DIAGNOSIS — Z9889 Other specified postprocedural states: Secondary | ICD-10-CM | POA: Diagnosis not present

## 2021-08-29 DIAGNOSIS — S143XXS Injury of brachial plexus, sequela: Secondary | ICD-10-CM | POA: Diagnosis not present

## 2021-08-29 DIAGNOSIS — M25631 Stiffness of right wrist, not elsewhere classified: Secondary | ICD-10-CM | POA: Diagnosis not present

## 2021-08-29 DIAGNOSIS — M25611 Stiffness of right shoulder, not elsewhere classified: Secondary | ICD-10-CM | POA: Diagnosis not present

## 2021-08-29 DIAGNOSIS — R29898 Other symptoms and signs involving the musculoskeletal system: Secondary | ICD-10-CM | POA: Diagnosis not present

## 2021-08-29 DIAGNOSIS — Z789 Other specified health status: Secondary | ICD-10-CM | POA: Diagnosis not present

## 2021-09-05 DIAGNOSIS — S143XXS Injury of brachial plexus, sequela: Secondary | ICD-10-CM | POA: Diagnosis not present

## 2021-09-05 DIAGNOSIS — R29898 Other symptoms and signs involving the musculoskeletal system: Secondary | ICD-10-CM | POA: Diagnosis not present

## 2021-09-05 DIAGNOSIS — M25611 Stiffness of right shoulder, not elsewhere classified: Secondary | ICD-10-CM | POA: Diagnosis not present

## 2021-09-05 DIAGNOSIS — Z789 Other specified health status: Secondary | ICD-10-CM | POA: Diagnosis not present

## 2021-09-05 DIAGNOSIS — M25631 Stiffness of right wrist, not elsewhere classified: Secondary | ICD-10-CM | POA: Diagnosis not present

## 2021-09-08 DIAGNOSIS — S143XXD Injury of brachial plexus, subsequent encounter: Secondary | ICD-10-CM | POA: Diagnosis not present

## 2021-09-09 DIAGNOSIS — R1900 Intra-abdominal and pelvic swelling, mass and lump, unspecified site: Secondary | ICD-10-CM | POA: Diagnosis not present

## 2021-09-09 DIAGNOSIS — T8133XA Disruption of traumatic injury wound repair, initial encounter: Secondary | ICD-10-CM | POA: Diagnosis not present

## 2021-09-12 DIAGNOSIS — R29898 Other symptoms and signs involving the musculoskeletal system: Secondary | ICD-10-CM | POA: Diagnosis not present

## 2021-09-12 DIAGNOSIS — S143XXS Injury of brachial plexus, sequela: Secondary | ICD-10-CM | POA: Diagnosis not present

## 2021-09-12 DIAGNOSIS — Z789 Other specified health status: Secondary | ICD-10-CM | POA: Diagnosis not present

## 2021-09-12 DIAGNOSIS — M25631 Stiffness of right wrist, not elsewhere classified: Secondary | ICD-10-CM | POA: Diagnosis not present

## 2021-09-12 DIAGNOSIS — M25611 Stiffness of right shoulder, not elsewhere classified: Secondary | ICD-10-CM | POA: Diagnosis not present

## 2021-09-22 DIAGNOSIS — M792 Neuralgia and neuritis, unspecified: Secondary | ICD-10-CM | POA: Diagnosis not present

## 2021-09-22 DIAGNOSIS — G588 Other specified mononeuropathies: Secondary | ICD-10-CM | POA: Diagnosis not present

## 2021-09-22 DIAGNOSIS — Z79899 Other long term (current) drug therapy: Secondary | ICD-10-CM | POA: Diagnosis not present

## 2021-09-22 DIAGNOSIS — Z9889 Other specified postprocedural states: Secondary | ICD-10-CM | POA: Diagnosis not present

## 2021-09-22 DIAGNOSIS — S143XXS Injury of brachial plexus, sequela: Secondary | ICD-10-CM | POA: Diagnosis not present

## 2021-09-22 DIAGNOSIS — N2889 Other specified disorders of kidney and ureter: Secondary | ICD-10-CM | POA: Diagnosis not present

## 2021-09-23 DIAGNOSIS — S3991XS Unspecified injury of abdomen, sequela: Secondary | ICD-10-CM | POA: Diagnosis not present

## 2021-09-23 DIAGNOSIS — R222 Localized swelling, mass and lump, trunk: Secondary | ICD-10-CM | POA: Diagnosis not present

## 2021-09-23 DIAGNOSIS — R109 Unspecified abdominal pain: Secondary | ICD-10-CM | POA: Diagnosis not present

## 2021-09-23 DIAGNOSIS — S3991XD Unspecified injury of abdomen, subsequent encounter: Secondary | ICD-10-CM | POA: Diagnosis not present

## 2021-10-03 DIAGNOSIS — M25611 Stiffness of right shoulder, not elsewhere classified: Secondary | ICD-10-CM | POA: Diagnosis not present

## 2021-10-03 DIAGNOSIS — S143XXS Injury of brachial plexus, sequela: Secondary | ICD-10-CM | POA: Diagnosis not present

## 2021-10-03 DIAGNOSIS — R29898 Other symptoms and signs involving the musculoskeletal system: Secondary | ICD-10-CM | POA: Diagnosis not present

## 2021-10-03 DIAGNOSIS — Z789 Other specified health status: Secondary | ICD-10-CM | POA: Diagnosis not present

## 2021-10-03 DIAGNOSIS — M25631 Stiffness of right wrist, not elsewhere classified: Secondary | ICD-10-CM | POA: Diagnosis not present

## 2021-10-10 DIAGNOSIS — M25611 Stiffness of right shoulder, not elsewhere classified: Secondary | ICD-10-CM | POA: Diagnosis not present

## 2021-10-10 DIAGNOSIS — R29898 Other symptoms and signs involving the musculoskeletal system: Secondary | ICD-10-CM | POA: Diagnosis not present

## 2021-10-10 DIAGNOSIS — M25631 Stiffness of right wrist, not elsewhere classified: Secondary | ICD-10-CM | POA: Diagnosis not present

## 2021-10-10 DIAGNOSIS — S143XXS Injury of brachial plexus, sequela: Secondary | ICD-10-CM | POA: Diagnosis not present

## 2021-10-10 DIAGNOSIS — Z789 Other specified health status: Secondary | ICD-10-CM | POA: Diagnosis not present

## 2021-10-31 DIAGNOSIS — R29898 Other symptoms and signs involving the musculoskeletal system: Secondary | ICD-10-CM | POA: Diagnosis not present

## 2021-10-31 DIAGNOSIS — S143XXS Injury of brachial plexus, sequela: Secondary | ICD-10-CM | POA: Diagnosis not present

## 2021-10-31 DIAGNOSIS — M25631 Stiffness of right wrist, not elsewhere classified: Secondary | ICD-10-CM | POA: Diagnosis not present

## 2021-10-31 DIAGNOSIS — M25611 Stiffness of right shoulder, not elsewhere classified: Secondary | ICD-10-CM | POA: Diagnosis not present

## 2021-10-31 DIAGNOSIS — Z789 Other specified health status: Secondary | ICD-10-CM | POA: Diagnosis not present

## 2021-11-07 DIAGNOSIS — M25631 Stiffness of right wrist, not elsewhere classified: Secondary | ICD-10-CM | POA: Diagnosis not present

## 2021-11-07 DIAGNOSIS — R29898 Other symptoms and signs involving the musculoskeletal system: Secondary | ICD-10-CM | POA: Diagnosis not present

## 2021-11-07 DIAGNOSIS — S143XXS Injury of brachial plexus, sequela: Secondary | ICD-10-CM | POA: Diagnosis not present

## 2021-11-07 DIAGNOSIS — Z789 Other specified health status: Secondary | ICD-10-CM | POA: Diagnosis not present

## 2021-11-07 DIAGNOSIS — M25611 Stiffness of right shoulder, not elsewhere classified: Secondary | ICD-10-CM | POA: Diagnosis not present

## 2021-11-28 DIAGNOSIS — M25631 Stiffness of right wrist, not elsewhere classified: Secondary | ICD-10-CM | POA: Diagnosis not present

## 2021-11-28 DIAGNOSIS — Z789 Other specified health status: Secondary | ICD-10-CM | POA: Diagnosis not present

## 2021-11-28 DIAGNOSIS — R29898 Other symptoms and signs involving the musculoskeletal system: Secondary | ICD-10-CM | POA: Diagnosis not present

## 2021-11-28 DIAGNOSIS — M25611 Stiffness of right shoulder, not elsewhere classified: Secondary | ICD-10-CM | POA: Diagnosis not present

## 2021-11-28 DIAGNOSIS — S143XXS Injury of brachial plexus, sequela: Secondary | ICD-10-CM | POA: Diagnosis not present

## 2021-12-10 DIAGNOSIS — S143XXD Injury of brachial plexus, subsequent encounter: Secondary | ICD-10-CM | POA: Diagnosis not present

## 2022-01-02 ENCOUNTER — Ambulatory Visit: Payer: Medicare Other | Admitting: Family Medicine

## 2022-01-21 ENCOUNTER — Ambulatory Visit (INDEPENDENT_AMBULATORY_CARE_PROVIDER_SITE_OTHER): Payer: Medicare Other

## 2022-01-21 VITALS — Ht 71.0 in | Wt 243.0 lb

## 2022-01-21 DIAGNOSIS — Z0000 Encounter for general adult medical examination without abnormal findings: Secondary | ICD-10-CM | POA: Diagnosis not present

## 2022-01-21 NOTE — Progress Notes (Signed)
Virtual Visit via Telephone Note  I connected with  Gregory Cox on 01/21/22 at  3:15 PM EDT by telephone and verified that I am speaking with the correct person using two identifiers.  Location: Patient: home Provider: [facility] Persons participating in the virtual visit: patient/Nurse Health Advisor   I discussed the limitations, risks, security and privacy concerns of performing an evaluation and management service by telephone and the availability of in person appointments. The patient expressed understanding and agreed to proceed.  Interactive audio and video telecommunications were attempted between this nurse and patient, however failed, due to patient having technical difficulties OR patient did not have access to video capability.  We continued and completed visit with audio only.  Some vital signs may be absent or patient reported.   Hal Hope, LPN  Subjective:   Gregory Cox is a 36 y.o. male who presents for an Initial Medicare Annual Wellness Visit.  Review of Systems     Cardiac Risk Factors include: advanced age (>39men, >27 women);male gender     Objective:    There were no vitals filed for this visit. There is no height or weight on file to calculate BMI.     01/21/2022    3:16 PM 04/29/2019    8:06 AM 06/17/2018    5:02 PM 04/14/2018   10:15 PM 03/25/2017   11:46 PM 09/06/2016    4:30 PM 11/14/2015    9:39 PM  Advanced Directives  Does Patient Have a Medical Advance Directive? No No No No No No No  Would patient like information on creating a medical advance directive? No - Patient declined No - Patient declined No - Patient declined        Current Medications (verified) Outpatient Encounter Medications as of 01/21/2022  Medication Sig   amitriptyline (ELAVIL) 50 MG tablet Take by mouth.   cyclobenzaprine (FLEXERIL) 5 MG tablet Take 5 mg by mouth 3 (three) times daily as needed.   gabapentin (NEURONTIN) 300 MG capsule Take 3 caps TID   [DISCONTINUED]  cyclobenzaprine (FLEXERIL) 5 MG tablet Take 1 tablet by mouth 3 (three) times daily as needed.   acetaminophen (TYLENOL) 325 MG tablet Take by mouth. (Patient not taking: Reported on 01/21/2022)   HYDROcodone-acetaminophen (NORCO/VICODIN) 5-325 MG tablet Take 1 tablet by mouth every 6 (six) hours as needed. (Patient not taking: Reported on 01/21/2022)   Multiple Vitamin (MULTIVITAMIN ADULT PO) Take 1 tablet by mouth daily. (Patient not taking: Reported on 01/21/2022)   No facility-administered encounter medications on file as of 01/21/2022.    Allergies (verified) Fish allergy, Fish-derived products, Shellfish-derived products, Ibuprofen, Penicillins, and Penicillins   History: Past Medical History:  Diagnosis Date   Acute kidney failure (HCC)    Asthma    Attention deficit disorder (ADD)    Bipolar 1 disorder (HCC)    Insomnia    Past Surgical History:  Procedure Laterality Date   WISDOM TOOTH EXTRACTION     Family History  Problem Relation Age of Onset   Diabetes Mother    Hypertension Mother    Diabetes Other    Hypertension Other    Kidney disease Other    Kidney disease Father    Social History   Socioeconomic History   Marital status: Single    Spouse name: Not on file   Number of children: Not on file   Years of education: Not on file   Highest education level: Not on file  Occupational History   Not on  file  Tobacco Use   Smoking status: Every Day    Packs/day: 0.50    Years: 7.00    Total pack years: 3.50    Types: Cigarettes   Smokeless tobacco: Never  Vaping Use   Vaping Use: Never used  Substance and Sexual Activity   Alcohol use: Not Currently   Drug use: No   Sexual activity: Not on file  Other Topics Concern   Not on file  Social History Narrative   Not on file   Social Determinants of Health   Financial Resource Strain: Low Risk  (01/21/2022)   Overall Financial Resource Strain (CARDIA)    Difficulty of Paying Living Expenses: Not very hard   Food Insecurity: No Food Insecurity (01/21/2022)   Hunger Vital Sign    Worried About Running Out of Food in the Last Year: Never true    Ran Out of Food in the Last Year: Never true  Transportation Needs: No Transportation Needs (01/21/2022)   PRAPARE - Administrator, Civil Service (Medical): No    Lack of Transportation (Non-Medical): No  Physical Activity: Insufficiently Active (01/21/2022)   Exercise Vital Sign    Days of Exercise per Week: 3 days    Minutes of Exercise per Session: 30 min  Stress: No Stress Concern Present (01/21/2022)   [employer] of Occupational Health - Occupational Stress Questionnaire    Feeling of Stress : Only a little  Social Connections: Moderately Isolated (01/21/2022)   Social Connection and Isolation Panel [NHANES]    Frequency of Communication with Friends and Family: More than three times a week    Frequency of Social Gatherings with Friends and Family: Once a week    Attends Religious Services: More than 4 times per year    Active Member of [employer] or Organizations: No    Attends Engineer, structural: Never    Marital Status: Never married    Tobacco Counseling Ready to quit: Not Answered Counseling given: Not Answered   Clinical Intake:  Pre-visit preparation completed: Yes  Pain : No/denies pain     Nutritional Risks: None Diabetes: No  How often do you need to have someone help you when you read instructions, pamphlets, or other written materials from your doctor or pharmacy?: 1 - Never  Diabetic?no  Interpreter Needed?: No  Information entered by :: Kennedy Bucker, LPN   Activities of Daily Living    01/21/2022    3:18 PM  In your present state of health, do you have any difficulty performing the following activities:  Hearing? 0  Vision? 0  Difficulty concentrating or making decisions? 0  Walking or climbing stairs? 0  Dressing or bathing? 0  Doing errands, shopping? 0  Preparing Food and  eating ? N  Using the Toilet? N  In the past six months, have you accidently leaked urine? N  Do you have problems with loss of bowel control? N  Managing your Medications? N  Managing your Finances? N  Housekeeping or managing your Housekeeping? N    Patient Care Team: Tommie Sams, DO as PCP - General (Family Medicine) Benetta Spar, MD (Internal Medicine)  Indicate any recent Medical Services you may have received from other than [facility] providers in the past year (date may be approximate).     Assessment:   This is a routine wellness examination for Gregory Cox.  Hearing/Vision screen Hearing Screening - Comments:: No aids Vision Screening - Comments:: Wears glasses- [facility] in  [place]  Dietary issues and exercise activities discussed: Current Exercise Habits: Home exercise routine, Type of exercise: walking, Time (Minutes): 30, Frequency (Times/Week): 3, Weekly Exercise (Minutes/Week): 90, Intensity: Mild   Goals Addressed             This Visit's Progress    DIET - EAT MORE FRUITS AND VEGETABLES         Depression Screen    01/21/2022    3:12 PM 07/03/2021    9:52 AM  PHQ 2/9 Scores  PHQ - 2 Score 1 0  PHQ- 9 Score 3     Fall Risk    01/21/2022    3:17 PM 07/03/2021    9:52 AM  Fall Risk   Falls in the past year? 1 1  Number falls in past yr: 0 0  Injury with Fall? 0 1  Risk for fall due to : History of fall(s) History of fall(s)  Follow up Falls prevention discussed Falls evaluation completed    [place]:  Any stairs in or around the home? No  If so, are there any without handrails? Yes  Home free of loose throw rugs in walkways, pet beds, electrical cords, etc? Yes  Adequate lighting in your home to reduce risk of falls? Yes   ASSISTIVE DEVICES UTILIZED TO PREVENT FALLS:  Life alert? No  Use of a cane, walker or w/c? Yes  Grab bars in the bathroom? No  Shower chair or bench in shower? No  Elevated  toilet seat or a handicapped toilet? No    Cognitive Function:        01/21/2022    3:19 PM  6CIT Screen  What Year? 0 points  What month? 0 points  What time? 0 points  Count back from 20 0 points  Months in reverse 0 points  Repeat phrase 0 points  Total Score 0 points    Immunizations Immunization History  Administered Date(s) Administered   Influenza-Unspecified 12/04/2020   Tdap 12/29/2013, 12/28/2020    TDAP status: Up to date  Flu Vaccine status: Up to date  Pneumococcal vaccine status: Declined,  Education has been provided regarding the importance of this vaccine but patient still declined. Advised may receive this vaccine at local pharmacy or Health Dept. Aware to provide a copy of the vaccination record if obtained from local pharmacy or Health Dept. Verbalized acceptance and understanding.   Covid-19 vaccine status: Declined, Education has been provided regarding the importance of this vaccine but patient still declined. Advised may receive this vaccine at local pharmacy or Health Dept.or vaccine clinic. Aware to provide a copy of the vaccination record if obtained from local pharmacy or Health Dept. Verbalized acceptance and understanding.  Qualifies for Shingles Vaccine? No   Zostavax completed No   Shingrix Completed?: No.    Education has been provided regarding the importance of this vaccine. Patient has been advised to call insurance company to determine out of pocket expense if they have not yet received this vaccine. Advised may also receive vaccine at local pharmacy or Health Dept. Verbalized acceptance and understanding.  Screening Tests Health Maintenance  Topic Date Due   COVID-19 Vaccine (1) Never done   Hepatitis C Screening  Never done   INFLUENZA VACCINE  11/18/2021   TETANUS/TDAP  12/29/2030   HIV Screening  Completed   HPV VACCINES  Aged Out    Health Maintenance  Health Maintenance Due  Topic Date Due   COVID-19 Vaccine (  1) Never done    Hepatitis C Screening  Never done   INFLUENZA VACCINE  11/18/2021    Lung Cancer Screening: (Low Dose CT Chest recommended if Age 16-80 years, 30 pack-year currently smoking OR have quit w/in 15years.) does not qualify.    Additional Screening:  Hepatitis C Screening: does qualify; Completed no  Vision Screening: Recommended annual ophthalmology exams for early detection of glaucoma and other disorders of the eye. Is the patient up to date with their annual eye exam?  Yes  Who is the provider or what is the name of the office in which the patient attends annual eye exams? [facility] in [place] If pt is not established with a provider, would they like to be referred to a provider to establish care? No .   Dental Screening: Recommended annual dental exams for proper oral hygiene  Community Resource Referral / Chronic Care Management: CRR required this visit?  No   CCM required this visit?  No      Plan:     I have personally reviewed and noted the following in the patient's chart:   Medical and social history Use of alcohol, tobacco or illicit drugs  Current medications and supplements including opioid prescriptions. Patient is not currently taking opioid prescriptions. Functional ability and status Nutritional status Physical activity Advanced directives List of other physicians Hospitalizations, surgeries, and ER visits in previous 12 months Vitals Screenings to include cognitive, depression, and falls Referrals and appointments  In addition, I have reviewed and discussed with patient certain preventive protocols, quality metrics, and best practice recommendations. A written personalized care plan for preventive services as well as general preventive health recommendations were provided to patient.     Hal Hope, LPN   [id number]   Nurse Notes: none

## 2022-03-08 ENCOUNTER — Emergency Department (HOSPITAL_COMMUNITY)
Admission: EM | Admit: 2022-03-08 | Discharge: 2022-03-08 | Disposition: A | Discharge status: Home / Self Care | Payer: Medicare Other | Attending: Emergency Medicine | Admitting: Emergency Medicine

## 2022-03-08 ENCOUNTER — Encounter (HOSPITAL_COMMUNITY): Payer: Self-pay | Admitting: Emergency Medicine

## 2022-03-08 DIAGNOSIS — S199XXA Unspecified injury of neck, initial encounter: Secondary | ICD-10-CM | POA: Diagnosis present

## 2022-03-08 DIAGNOSIS — Y92481 Parking lot as the place of occurrence of the external cause: Secondary | ICD-10-CM | POA: Diagnosis not present

## 2022-03-08 DIAGNOSIS — J45909 Unspecified asthma, uncomplicated: Secondary | ICD-10-CM | POA: Diagnosis not present

## 2022-03-08 DIAGNOSIS — F17210 Nicotine dependence, cigarettes, uncomplicated: Secondary | ICD-10-CM | POA: Diagnosis not present

## 2022-03-08 DIAGNOSIS — S161XXA Strain of muscle, fascia and tendon at neck level, initial encounter: Secondary | ICD-10-CM | POA: Diagnosis not present

## 2022-03-08 MED ORDER — METHOCARBAMOL 500 MG PO TABS: 500.0000 mg | ORAL_TABLET | Freq: Three times a day (TID) | ORAL | 0 refills | Status: DC | PRN

## 2022-03-08 MED ORDER — ACETAMINOPHEN 500 MG PO TABS
1000.0000 mg | ORAL_TABLET | Freq: Once | ORAL | Status: AC
  Administered 2022-03-08: 1000 mg via ORAL
  Filled 2022-03-08: qty 2
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
## 2022-03-08 NOTE — Discharge Instructions (Signed)
You were evaluated in the Emergency Department and after careful evaluation, we did not find any emergent condition requiring admission or further testing in the hospital.  Your exam/testing today is overall reassuring.  Symptoms likely due to muscular strain.  Use Tylenol for pain every 4 hours, use the the Robaxin muscle relaxer for more significant pain.  Please return to the Emergency Department if you experience any worsening of your condition.   Thank you for allowing [place] to be a part of your care.

## 2022-03-18 ENCOUNTER — Telehealth: Payer: Self-pay | Admitting: *Deleted

## 2022-03-18 NOTE — Telephone Encounter (Signed)
     Patient  visit on 03/08/2022  at [facility] was for neck pain   Have you been able to follow up with your primary care physician? Patient is feeling some better been to dr and the dr has him doing PT  The patient was  able to obtain any needed medicine or equipment. yes Are there diet recommendations that you are having difficulty following?  Patient expresses understanding of discharge instructions and education provided has no other needs at this time.  Yehuda Mao Greenauer -[facility] [place], Population Health [phone] [address] [place] , [place] [address] Email : [email or web address]

## 2022-04-24 ENCOUNTER — Encounter: Payer: Self-pay | Admitting: Family Medicine

## 2022-04-24 ENCOUNTER — Ambulatory Visit (INDEPENDENT_AMBULATORY_CARE_PROVIDER_SITE_OTHER): Payer: 59 | Admitting: Family Medicine

## 2022-04-24 VITALS — BP 118/74 | HR 67 | Temp 98.4°F | Wt 253.0 lb

## 2022-04-24 DIAGNOSIS — G43009 Migraine without aura, not intractable, without status migrainosus: Secondary | ICD-10-CM | POA: Diagnosis not present

## 2022-04-24 MED ORDER — BUTALBITAL-APAP-CAFFEINE 50-325-40 MG PO TABS: 1.0000 | ORAL_TABLET | Freq: Four times a day (QID) | ORAL | 1 refills | Status: DC | PRN

## 2022-04-24 NOTE — Assessment & Plan Note (Signed)
Cannot take NSAIDs. Trial of Fioricet.
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
## 2022-11-06 ENCOUNTER — Ambulatory Visit (INDEPENDENT_AMBULATORY_CARE_PROVIDER_SITE_OTHER): Payer: 59 | Admitting: Family Medicine

## 2022-11-06 ENCOUNTER — Encounter: Payer: Self-pay | Admitting: Family Medicine

## 2022-11-06 VITALS — BP 105/70 | HR 61 | Temp 97.7°F | Ht 71.0 in | Wt 232.0 lb

## 2022-11-06 DIAGNOSIS — M545 Low back pain, unspecified: Secondary | ICD-10-CM | POA: Diagnosis not present

## 2022-11-06 DIAGNOSIS — R109 Unspecified abdominal pain: Secondary | ICD-10-CM

## 2022-11-06 DIAGNOSIS — R369 Urethral discharge, unspecified: Secondary | ICD-10-CM | POA: Diagnosis not present

## 2022-11-06 LAB — POCT URINALYSIS DIP (CLINITEK)
Bilirubin, UA: NEGATIVE
Blood, UA: NEGATIVE
Glucose, UA: NEGATIVE mg/dL
Ketones, POC UA: NEGATIVE mg/dL
Nitrite, UA: NEGATIVE
Spec Grav, UA: >=1.03 — AB (ref 1.010–1.025)
pH, UA: 6 (ref 5.0–8.0)

## 2022-11-06 MED ORDER — PREDNISONE 10 MG PO TABS: ORAL_TABLET | ORAL | 0 refills | Status: DC

## 2022-11-06 NOTE — Progress Notes (Unsigned)
Subjective:  Patient ID: Gregory Cox, male    DOB: 1986/01/20  Age: 37 y.o. MRN: [id number]  CC: Chief Complaint  Patient presents with   Follow-up    Lower back pain , excess thirst , fatigue    HPI:  37 year old male presents for follow up.   Patient Active Problem List   Diagnosis Date Noted   Migraine without aura and without status migrainosus, not intractable 04/24/2022   Brachial plexus injury 07/03/2021   Tobacco abuse 07/03/2021   Bipolar disorder ([facility]) 07/03/2021    Social Hx   Social History   Socioeconomic History   Marital status: Single    Spouse name: Not on file   Number of children: Not on file   Years of education: Not on file   Highest education level: Not on file  Occupational History   Not on file  Tobacco Use   Smoking status: Every Day    Current packs/day: 0.50    Average packs/day: 0.5 packs/day for 7.0 years (3.5 ttl pk-yrs)    Types: Cigarettes   Smokeless tobacco: Never  Vaping Use   Vaping status: Never Used  Substance and Sexual Activity   Alcohol use: Not Currently   Drug use: No   Sexual activity: Not on file  Other Topics Concern   Not on file  Social History Narrative   Not on file   Social Determinants of Health   Financial Resource Strain: Low Risk  (01/21/2022)   Overall Financial Resource Strain (CARDIA)    Difficulty of Paying Living Expenses: Not very hard  Food Insecurity: No Food Insecurity (01/21/2022)   Hunger Vital Sign    Worried About Running Out of Food in the Last Year: Never true    Ran Out of Food in the Last Year: Never true  Transportation Needs: No Transportation Needs (01/21/2022)   PRAPARE - Administrator, Civil Service (Medical): No    Lack of Transportation (Non-Medical): No  Physical Activity: Insufficiently Active (01/21/2022)   Exercise Vital Sign    Days of Exercise per Week: 3 days    Minutes of Exercise per Session: 30 min  Stress: No Stress Concern Present (01/21/2022)    [employer] of Occupational Health - Occupational Stress Questionnaire    Feeling of Stress : Only a little  Social Connections: Moderately Isolated (01/21/2022)   Social Connection and Isolation Panel [NHANES]    Frequency of Communication with Friends and Family: More than three times a week    Frequency of Social Gatherings with Friends and Family: Once a week    Attends Religious Services: More than 4 times per year    Active Member of [employer] or Organizations: No    Attends Engineer, structural: Never    Marital Status: Never married    Review of Systems Per HPI  Objective:  BP 105/70   Pulse 61   Temp 97.7 F (36.5 C)   Ht 5\' 11"  (1.803 m)   Wt 232 lb (105.2 kg)   SpO2 96%   BMI 32.36 kg/m      11/06/2022    4:07 PM 04/24/2022    1:16 PM 03/08/2022    1:05 AM  BP/Weight  Systolic BP 105 118 115  Diastolic BP 70 74 72  Wt. (Lbs) 232 253   BMI 32.36 kg/m2 35.29 kg/m2     Physical Exam  Lab Results  Component Value Date   WBC 11.8 (H) 01/05/2021  HGB 13.1 01/05/2021   HCT 39.8 01/05/2021   PLT 319 01/05/2021   GLUCOSE 97 01/05/2021   TRIG 69 12/29/2020   ALT 39 01/05/2021   AST 34 01/05/2021   NA 135 01/05/2021   K 3.1 (L) 01/05/2021   CL 98 01/05/2021   CREATININE 0.80 01/05/2021   BUN 13 01/05/2021   CO2 29 01/05/2021   INR 1.1 12/28/2020     Assessment & Plan:   Problem List Items Addressed This Visit   None Visit Diagnoses     Flank pain    -  Primary   Relevant Orders   POCT URINALYSIS DIP (CLINITEK) (Completed)   Penile discharge       Relevant Orders   Urine Culture   Chlamydia/Gonococcus/Trichomonas, NAA       Meds ordered this encounter  Medications   predniSONE (DELTASONE) 10 MG tablet    Sig: 50 mg daily x 2 days, then 40 mg daily x 2 days, then 30 mg daily x 2 days, then 20 mg daily x 2 days, then 10 mg daily x 2 days.    Dispense:  30 tablet    Refill:  0    Follow-up:  No follow-ups on file.  Everlene Other  DO [facility]

## 2022-11-06 NOTE — Patient Instructions (Signed)
Prednisone as prescribed.  We will call with urine results.  Lots of water.

## 2022-11-07 DIAGNOSIS — R369 Urethral discharge, unspecified: Secondary | ICD-10-CM | POA: Insufficient documentation

## 2022-11-07 DIAGNOSIS — M545 Low back pain, unspecified: Secondary | ICD-10-CM | POA: Insufficient documentation

## 2022-11-07 NOTE — Assessment & Plan Note (Signed)
UA with trace leukocytes.  Advised to drink more water as his urine is quite concentrated.  Sending culture and also testing for STD.

## 2022-11-07 NOTE — Assessment & Plan Note (Signed)
Still quite symptomatic.  Has not had a significant improvement relaxers.  Cannot take ibuprofen.  Avoiding other NSAIDs as a result.  Placing on prednisone.

## 2022-11-09 LAB — CHLAMYDIA/GONOCOCCUS/TRICHOMONAS, NAA
Chlamydia by NAA: NEGATIVE
Gonococcus by NAA: NEGATIVE
Trich vag by NAA: NEGATIVE

## 2022-11-10 LAB — URINE CULTURE

## 2023-04-22 DIAGNOSIS — M25621 Stiffness of right elbow, not elsewhere classified: Secondary | ICD-10-CM | POA: Diagnosis not present

## 2023-04-22 DIAGNOSIS — G540 Brachial plexus disorders: Secondary | ICD-10-CM | POA: Diagnosis not present

## 2023-04-22 DIAGNOSIS — Z4889 Encounter for other specified surgical aftercare: Secondary | ICD-10-CM | POA: Diagnosis not present

## 2023-04-22 DIAGNOSIS — S143XXS Injury of brachial plexus, sequela: Secondary | ICD-10-CM | POA: Diagnosis not present

## 2023-04-22 DIAGNOSIS — M25631 Stiffness of right wrist, not elsewhere classified: Secondary | ICD-10-CM | POA: Diagnosis not present

## 2023-04-22 DIAGNOSIS — S143XXD Injury of brachial plexus, subsequent encounter: Secondary | ICD-10-CM | POA: Diagnosis not present

## 2023-04-22 DIAGNOSIS — R29898 Other symptoms and signs involving the musculoskeletal system: Secondary | ICD-10-CM | POA: Diagnosis not present

## 2023-04-22 DIAGNOSIS — M25611 Stiffness of right shoulder, not elsewhere classified: Secondary | ICD-10-CM | POA: Diagnosis not present

## 2023-04-22 DIAGNOSIS — Z789 Other specified health status: Secondary | ICD-10-CM | POA: Diagnosis not present

## 2023-04-22 DIAGNOSIS — M2560 Stiffness of unspecified joint, not elsewhere classified: Secondary | ICD-10-CM | POA: Diagnosis not present

## 2023-04-22 DIAGNOSIS — Z9889 Other specified postprocedural states: Secondary | ICD-10-CM | POA: Diagnosis not present

## 2023-05-06 DIAGNOSIS — S143XXD Injury of brachial plexus, subsequent encounter: Secondary | ICD-10-CM | POA: Diagnosis not present

## 2023-05-06 DIAGNOSIS — Z4889 Encounter for other specified surgical aftercare: Secondary | ICD-10-CM | POA: Diagnosis not present

## 2023-05-06 DIAGNOSIS — Z9889 Other specified postprocedural states: Secondary | ICD-10-CM | POA: Diagnosis not present

## 2023-05-06 DIAGNOSIS — Z789 Other specified health status: Secondary | ICD-10-CM | POA: Diagnosis not present

## 2023-05-06 DIAGNOSIS — M25631 Stiffness of right wrist, not elsewhere classified: Secondary | ICD-10-CM | POA: Diagnosis not present

## 2023-05-06 DIAGNOSIS — M25611 Stiffness of right shoulder, not elsewhere classified: Secondary | ICD-10-CM | POA: Diagnosis not present

## 2023-05-06 DIAGNOSIS — G540 Brachial plexus disorders: Secondary | ICD-10-CM | POA: Diagnosis not present

## 2023-05-06 DIAGNOSIS — S143XXS Injury of brachial plexus, sequela: Secondary | ICD-10-CM | POA: Diagnosis not present

## 2023-05-06 DIAGNOSIS — M25621 Stiffness of right elbow, not elsewhere classified: Secondary | ICD-10-CM | POA: Diagnosis not present

## 2023-05-06 DIAGNOSIS — M2560 Stiffness of unspecified joint, not elsewhere classified: Secondary | ICD-10-CM | POA: Diagnosis not present

## 2023-05-06 DIAGNOSIS — R29898 Other symptoms and signs involving the musculoskeletal system: Secondary | ICD-10-CM | POA: Diagnosis not present

## 2023-05-21 ENCOUNTER — Ambulatory Visit (INDEPENDENT_AMBULATORY_CARE_PROVIDER_SITE_OTHER): Payer: 59

## 2023-05-21 VITALS — Ht 74.0 in | Wt 246.0 lb

## 2023-05-21 DIAGNOSIS — Z0000 Encounter for general adult medical examination without abnormal findings: Secondary | ICD-10-CM | POA: Diagnosis not present

## 2023-05-21 DIAGNOSIS — Z0100 Encounter for examination of eyes and vision without abnormal findings: Secondary | ICD-10-CM

## 2023-05-21 NOTE — Progress Notes (Signed)
Please attest and cosign this visit due to patients primary care provider not being in the office at the time the visit was completed.   Because this visit was a virtual/telehealth visit,  certain criteria was not obtained, such a blood pressure, CBG if applicable, and timed get up and go. Any medications not marked as "taking" were not mentioned during the medication reconciliation part of the visit. Any vitals not documented were not able to be obtained due to this being a telehealth visit or patient was unable to self-report a recent blood pressure reading due to a lack of equipment at home via telehealth. Vitals that have been documented are verbally provided by the patient.  Interactive audio and video telecommunications were attempted between this provider and patient, however failed, due to patient having technical difficulties OR patient did not have access to video capability.  We continued and completed visit with audio only.  Subjective:   Gregory Cox is a 38 y.o. male who presents for Medicare Annual/Subsequent preventive examination.  Visit Complete: Virtual I connected with  [name] on 05/21/23 by a audio enabled telemedicine application and verified that I am speaking with the correct person using two identifiers.  Patient Location: Home  Provider Location: Home Office  I discussed the limitations of evaluation and management by telemedicine. The patient expressed understanding and agreed to proceed.  Vital Signs: Because this visit was a virtual/telehealth visit, some criteria may be missing or patient reported. Any vitals not documented were not able to be obtained and vitals that have been documented are patient reported.  Patient Medicare AWV questionnaire was completed by the patient on na; I have confirmed that all information answered by patient is correct and no changes since this date.  Cardiac Risk Factors include: advanced age (>72men, >33 women);male  gender;sedentary lifestyle;smoking/ tobacco exposure     Objective:    Today's Vitals   05/21/23 1520 05/21/23 1522  Weight: 246 lb (111.6 kg)   Height: 6\' 2"  (1.88 m)   PainSc:  7    Body mass index is 31.58 kg/m.     05/21/2023    3:26 PM 01/21/2022    3:16 PM 04/29/2019    8:06 AM 06/17/2018    5:02 PM 04/14/2018   10:15 PM 03/25/2017   11:46 PM 09/06/2016    4:30 PM  Advanced Directives  Does Patient Have a Medical Advance Directive? No No No No No No No  Would patient like information on creating a medical advance directive? No - Patient declined No - Patient declined No - Patient declined No - Patient declined       Current Medications (verified) Outpatient Encounter Medications as of 05/21/2023  Medication Sig   amitriptyline (ELAVIL) 50 MG tablet Take by mouth.   butalbital-acetaminophen-caffeine (FIORICET) 50-325-40 MG tablet Take 1 tablet by mouth every 6 (six) hours as needed for headache or migraine.   gabapentin (NEURONTIN) 300 MG capsule Take 3 caps TID   methocarbamol (ROBAXIN) 500 MG tablet Take 1 tablet (500 mg total) by mouth every 8 (eight) hours as needed for muscle spasms. (Patient not taking: Reported on 05/21/2023)   predniSONE (DELTASONE) 10 MG tablet 50 mg daily x 2 days, then 40 mg daily x 2 days, then 30 mg daily x 2 days, then 20 mg daily x 2 days, then 10 mg daily x 2 days. (Patient not taking: Reported on 05/21/2023)   No facility-administered encounter medications on file as of 05/21/2023.    Allergies (  verified) Fish allergy, Fish-derived products, Shellfish-derived products, Ibuprofen, Penicillins, and Penicillins   History: Past Medical History:  Diagnosis Date   Acute kidney failure (HCC)    Asthma    Attention deficit disorder (ADD)    Bipolar 1 disorder (HCC)    Insomnia    Past Surgical History:  Procedure Laterality Date   WISDOM TOOTH EXTRACTION     Family History  Problem Relation Age of Onset   Diabetes Mother    Hypertension  Mother    Diabetes Other    Hypertension Other    Kidney disease Other    Kidney disease Father    Social History   Socioeconomic History   Marital status: Single    Spouse name: Not on file   Number of children: Not on file   Years of education: Not on file   Highest education level: Not on file  Occupational History   Not on file  Tobacco Use   Smoking status: Every Day    Current packs/day: 0.50    Average packs/day: 0.5 packs/day for 7.0 years (3.5 ttl pk-yrs)    Types: Cigarettes   Smokeless tobacco: Never  Vaping Use   Vaping status: Never Used  Substance and Sexual Activity   Alcohol use: Not Currently   Drug use: No   Sexual activity: Not on file  Other Topics Concern   Not on file  Social History Narrative   Not on file   Social Drivers of Health   Financial Resource Strain: Low Risk  (05/21/2023)   Overall Financial Resource Strain (CARDIA)    Difficulty of Paying Living Expenses: Not hard at all  Food Insecurity: No Food Insecurity (05/21/2023)   Hunger Vital Sign    Worried About Running Out of Food in the Last Year: Never true    Ran Out of Food in the Last Year: Never true  Transportation Needs: No Transportation Needs (05/21/2023)   PRAPARE - Administrator, Civil Service (Medical): No    Lack of Transportation (Non-Medical): No  Physical Activity: Sufficiently Active (05/21/2023)   Exercise Vital Sign    Days of Exercise per Week: 7 days    Minutes of Exercise per Session: 30 min  Stress: No Stress Concern Present (05/21/2023)   [employer] of Occupational Health - Occupational Stress Questionnaire    Feeling of Stress : Not at all  Social Connections: Moderately Isolated (05/21/2023)   Social Connection and Isolation Panel [NHANES]    Frequency of Communication with Friends and Family: More than three times a week    Frequency of Social Gatherings with Friends and Family: More than three times a week    Attends Religious Services:  Never    Database administrator or Organizations: No    Attends Engineer, structural: Never    Marital Status: Married    Tobacco Counseling Ready to quit: Yes Counseling given: Yes   Clinical Intake:  Pre-visit preparation completed: Yes  Pain : 0-10 Pain Score: 7  Pain Type: Chronic pain Pain Location: Wrist Pain Orientation: Right Pain Descriptors / Indicators: Constant, Throbbing Pain Onset: More than a month ago Pain Frequency: Constant Effect of Pain on Daily Activities: yes     BMI - recorded: 31.58 Nutritional Status: BMI > 30  Obese Nutritional Risks: None Diabetes: No  How often do you need to have someone help you when you read instructions, pamphlets, or other written materials from your doctor or pharmacy?: 1 - Never  Interpreter Needed?: No  Information entered by :: Maryjean Ka CMA   Activities of Daily Living    05/21/2023    3:25 PM  In your present state of health, do you have any difficulty performing the following activities:  Hearing? 0  Vision? 0  Difficulty concentrating or making decisions? 0  Walking or climbing stairs? 0  Dressing or bathing? 0  Doing errands, shopping? 0  Preparing Food and eating ? N  Using the Toilet? N  In the past six months, have you accidently leaked urine? N  Do you have problems with loss of bowel control? N  Managing your Medications? N  Managing your Finances? N  Housekeeping or managing your Housekeeping? N    Patient Care Team: Tommie Sams, DO as PCP - General (Family Medicine) Benetta Spar, MD (Internal Medicine)  Indicate any recent Medical Services you may have received from other than [facility] providers in the past year (date may be approximate).     Assessment:   This is a routine wellness examination for Jesiel.  Hearing/Vision screen Hearing Screening - Comments:: Patient denies any hearing difficulties.   Vision Screening - Comments:: Patient wears glasses and is not up  to date with eye exams.Referral placed today.     Goals Addressed             This Visit's Progress    Patient Stated       Work on improving the use of my RT arm       Depression Screen    05/21/2023    3:39 PM 04/24/2022    1:20 PM 01/21/2022    3:12 PM 07/03/2021    9:52 AM  PHQ 2/9 Scores  PHQ - 2 Score 2 5 1  0  PHQ- 9 Score 6 16 3      Fall Risk    05/21/2023    3:38 PM 04/24/2022    1:20 PM 01/21/2022    3:17 PM 07/03/2021    9:52 AM  Fall Risk   Falls in the past year? 0 0 1 1  Number falls in past yr: 0 0 0 0  Injury with Fall? 0 0 0 1  Risk for fall due to : No Fall Risks No Fall Risks History of fall(s) History of fall(s)  Follow up Falls prevention discussed Falls evaluation completed Falls prevention discussed Falls evaluation completed    MEDICARE RISK AT HOME: Medicare Risk at Home Any stairs in or around the home?: Yes If so, are there any without handrails?: No Home free of loose throw rugs in walkways, pet beds, electrical cords, etc?: Yes Adequate lighting in your home to reduce risk of falls?: Yes Life alert?: No Use of a cane, walker or w/c?: No Grab bars in the bathroom?: Yes Shower chair or bench in shower?: Yes Elevated toilet seat or a handicapped toilet?: Yes  TIMED UP AND GO:  Was the test performed?  No    Cognitive Function:        05/21/2023    3:39 PM 01/21/2022    3:19 PM  6CIT Screen  What Year? 0 points 0 points  What month? 0 points 0 points  What time? 0 points 0 points  Count back from 20 0 points 0 points  Months in reverse 0 points 0 points  Repeat phrase 0 points 0 points  Total Score 0 points 0 points    Immunizations Immunization History  Administered Date(s) Administered   Influenza-Unspecified 12/04/2020  Tdap 12/29/2013, 12/28/2020    TDAP status: Up to date  Flu Vaccine status: Up to date  Pneumococcal vaccine status: Due, Education has been provided regarding the importance of this vaccine. Advised  may receive this vaccine at local pharmacy or Health Dept. Aware to provide a copy of the vaccination record if obtained from local pharmacy or Health Dept. Verbalized acceptance and understanding.  Covid-19 vaccine status: Information provided on how to obtain vaccines.   Qualifies for Shingles Vaccine? No    Screening Tests Health Maintenance  Topic Date Due   COVID-19 Vaccine (1) Never done   Pneumococcal Vaccine 78-44 Years old (1 of 2 - PCV) Never done   Hepatitis C Screening  Never done   INFLUENZA VACCINE  11/19/2022   Medicare Annual Wellness (AWV)  01/22/2023   DTaP/Tdap/Td (3 - Td or Tdap) 12/29/2030   HIV Screening  Completed   HPV VACCINES  Aged Out    Health Maintenance  Health Maintenance Due  Topic Date Due   COVID-19 Vaccine (1) Never done   Pneumococcal Vaccine 57-17 Years old (1 of 2 - PCV) Never done   Hepatitis C Screening  Never done   INFLUENZA VACCINE  11/19/2022   Medicare Annual Wellness (AWV)  01/22/2023    Colorectal Cancer Screenings: Not age appropriate for this patient.    Lung Cancer Screening: (Low Dose CT Chest recommended if Age 21-80 years, 20 pack-year currently smoking OR have quit w/in 15years.) does not qualify.   Lung Cancer Screening Referral: na  Additional Screening:  Hepatitis C Screening: does qualify; Ordered 05/21/2023  Vision Screening: Recommended annual ophthalmology exams for early detection of glaucoma and other disorders of the eye. Is the patient up to date with their annual eye exam?  No  Who is the provider or what is the name of the office in which the patient attends annual eye exams? Referral placed If pt is not established with a provider, would they like to be referred to a provider to establish care? Yes .   Dental Screening: Recommended annual dental exams for proper oral hygiene  Diabetic Foot Exam: na  Community Resource Referral / Chronic Care Management: CRR required this visit?  No   CCM required  this visit?  No     Plan:     I have personally reviewed and noted the following in the patient's chart:   Medical and social history Use of alcohol, tobacco or illicit drugs  Current medications and supplements including opioid prescriptions. Patient is not currently taking opioid prescriptions. Functional ability and status Nutritional status Physical activity Advanced directives List of other physicians Hospitalizations, surgeries, and ER visits in previous 12 months Vitals Screenings to include cognitive, depression, and falls Referrals and appointments  In addition, I have reviewed and discussed with patient certain preventive protocols, quality metrics, and best practice recommendations. A written personalized care plan for preventive services as well as general preventive health recommendations were provided to patient.     Jordan Hawks Macaiah Mangal, CMA   05/21/2023   After Visit Summary: (Mail) Due to this being a telephonic visit, the after visit summary with patients personalized plan was offered to patient via mail   Nurse Notes:

## 2023-05-21 NOTE — Patient Instructions (Addendum)
Gregory Cox , Thank you for taking time to come for your Medicare Wellness Visit. I appreciate your ongoing commitment to your health goals. Please review the following plan we discussed and let me know if I can assist you in the future.   Referrals/Orders/Follow-Ups/Clinician Recommendations:  Next Medicare Annual Wellness Visit:  June 02, 2024 at 10:00 am virtual visit  You've been referred to see Dr. Sinda Du for an eye exam. If you haven't heard from his office in about a week, please call to schedule your appointment.   Sinda Du, MD [address] [place] [address]  Phone: [phone]  This is a list of the screening recommended for you and due dates:  Health Maintenance  Topic Date Due   COVID-19 Vaccine (1) Never done   Pneumococcal Vaccination (1 of 2 - PCV) Never done   Hepatitis C Screening  Never done   Medicare Annual Wellness Visit  05/20/2024   DTaP/Tdap/Td vaccine (3 - Td or Tdap) 12/29/2030   Flu Shot  Completed   HIV Screening  Completed   HPV Vaccine  Aged Out    Advanced directives: (ACP Link)Information on Advanced Care Planning can be found at [facility] [place] Advance Health Care Directives Advance Health Care Directives ([email or web address]   Next Medicare Annual Wellness Visit scheduled for next year: yes  Preventive Care 60-23 Years Old, Male Preventive care refers to lifestyle choices and visits with your health care provider that can promote health and wellness. Preventive care visits are also called wellness exams. What can I expect for my preventive care visit? Counseling During your preventive care visit, your health care provider may ask about your: Medical history, including: Past medical problems. Family medical history. Current health, including: Emotional well-being. Home life and relationship well-being. Sexual activity. Lifestyle, including: Alcohol, nicotine or tobacco, and drug use. Access to firearms. Diet,  exercise, and sleep habits. Safety issues such as seatbelt and bike helmet use. Sunscreen use. Work and work Astronomer. Physical exam Your health care provider may check your: Height and weight. These may be used to calculate your BMI (body mass index). BMI is a measurement that tells if you are at a healthy weight. Waist circumference. This measures the distance around your waistline. This measurement also tells if you are at a healthy weight and may help predict your risk of certain diseases, such as type 2 diabetes and high blood pressure. Heart rate and blood pressure. Body temperature. Skin for abnormal spots. What immunizations do I need?  Vaccines are usually given at various ages, according to a schedule. Your health care provider will recommend vaccines for you based on your age, medical history, and lifestyle or other factors, such as travel or where you work. What tests do I need? Screening Your health care provider may recommend screening tests for certain conditions. This may include: Lipid and cholesterol levels. Diabetes screening. This is done by checking your blood sugar (glucose) after you have not eaten for a while (fasting). Hepatitis B test. Hepatitis C test. HIV (human immunodeficiency virus) test. STI (sexually transmitted infection) testing, if you are at risk. Talk with your health care provider about your test results, treatment options, and if necessary, the need for more tests. Follow these instructions at home: Eating and drinking  Eat a healthy diet that includes fresh fruits and vegetables, whole grains, lean protein, and low-fat dairy products. Drink enough fluid to keep your urine pale yellow. Take vitamin and mineral supplements as  recommended by your health care provider. Do not drink alcohol if your health care provider tells you not to drink. If you drink alcohol: Limit how much you have to 0-2 drinks a day. Know how much alcohol is in your  drink. In the [place] one drink equals one 12 oz bottle of beer (355 mL), one 5 oz glass of wine (148 mL), or one 1 oz glass of hard liquor (44 mL). Lifestyle Brush your teeth every morning and night with fluoride toothpaste. Floss one time each day. Exercise for at least 30 minutes 5 or more days each week. Do not use any products that contain nicotine or tobacco. These products include cigarettes, chewing tobacco, and vaping devices, such as e-cigarettes. If you need help quitting, ask your health care provider. Do not use drugs. If you are sexually active, practice safe sex. Use a condom or other form of protection to prevent STIs. Find healthy ways to manage stress, such as: Meditation, yoga, or listening to music. Journaling. Talking to a trusted person. Spending time with friends and family. Minimize exposure to UV radiation to reduce your risk of skin cancer. Safety Always wear your seat belt while driving or riding in a vehicle. Do not drive: If you have been drinking alcohol. Do not ride with someone who has been drinking. If you have been using any mind-altering substances or drugs. While texting. When you are tired or distracted. Wear a helmet and other protective equipment during sports activities. If you have firearms in your house, make sure you follow all gun safety procedures. Seek help if you have been physically or sexually abused. What's next? Go to your health care provider once a year for an annual wellness visit. Ask your health care provider how often you should have your eyes and teeth checked. Stay up to date on all vaccines. This information is not intended to replace advice given to you by your health care provider. Make sure you discuss any questions you have with your health care provider. Document Revised: 10/02/2020 Document Reviewed: 10/02/2020 Elsevier Patient Education  2024 [employer].  Understanding Your Risk for Falls Millions of people have  serious injuries from falls each year. It is important to understand your risk of falling. Talk with your health care provider about your risk and what you can do to lower it. If you do have a serious fall, make sure to tell your provider. Falling once raises your risk of falling again. How can falls affect me? Serious injuries from falls are common. These include: Broken bones, such as hip fractures. Head injuries, such as traumatic brain injuries (TBI) or concussions. A fear of falling can cause you to avoid activities and stay at home. This can make your muscles weaker and raise your risk for a fall. What can increase my risk? There are a number of risk factors that increase your risk for falling. The more risk factors you have, the higher your risk of falling. Serious injuries from a fall happen most often to people who are older than 37 years old. Teenagers and young adults ages 21-29 are also at higher risk. Common risk factors include: Weakness in the lower body. Being generally weak or confused due to long-term (chronic) illness. Dizziness or balance problems. Poor vision. Medicines that cause dizziness or drowsiness. These may include: Medicines for your blood pressure, heart, anxiety, insomnia, or swelling (edema). Pain medicines. Muscle relaxants. Other risk factors include: Drinking alcohol. Having had a fall in the past.  Having foot pain or wearing improper footwear. Working at a dangerous job. Having any of the following in your home: Tripping hazards, such as floor clutter or loose rugs. Poor lighting. Pets. Having dementia or memory loss. What actions can I take to lower my risk of falling?     Physical activity Stay physically fit. Do strength and balance exercises. Consider taking a regular class to build strength and balance. Yoga and tai chi are good options. Vision Have your eyes checked every year and your prescription for glasses or contacts updated as  needed. Shoes and walking aids Wear non-skid shoes. Wear shoes that have rubber soles and low heels. Do not wear high heels. Do not walk around the house in socks or slippers. Use a cane or walker as told by your provider. Home safety Attach secure railings on both sides of your stairs. Install grab bars for your bathtub, shower, and toilet. Use a non-skid mat in your bathtub or shower. Attach bath mats securely with double-sided, non-slip rug tape. Use good lighting in all rooms. Keep a flashlight near your bed. Make sure there is a clear path from your bed to the bathroom. Use night-lights. Do not use throw rugs. Make sure all carpeting is taped or tacked down securely. Remove all clutter from walkways and stairways, including extension cords. Repair uneven or broken steps and floors. Avoid walking on icy or slippery surfaces. Walk on the grass instead of on icy or slick sidewalks. Use ice melter to get rid of ice on walkways in the winter. Use a cordless phone. Questions to ask your health care provider Can you help me check my risk for a fall? Do any of my medicines make me more likely to fall? Should I take a vitamin D supplement? What exercises can I do to improve my strength and balance? Should I make an appointment to have my vision checked? Do I need a bone density test to check for weak bones (osteoporosis)? Would it help to use a cane or a walker? Where to find more information Centers for Disease Control and Prevention, STEADI: TonerPromos.no Community-Based Fall Prevention Programs: TonerPromos.no [employer] on Aging: BaseRingTones.pl Contact a health care provider if: You fall at home. You are afraid of falling at home. You feel weak, drowsy, or dizzy. This information is not intended to replace advice given to you by your health care provider. Make sure you discuss any questions you have with your health care provider. Document Revised: 12/08/2021 Document Reviewed:  12/08/2021 Elsevier Patient Education  2024 [employer].

## 2023-05-21 NOTE — Addendum Note (Signed)
Addended by: Recardo Evangelist A on: 05/21/2023 04:12 PM   Modules accepted: Orders

## 2023-05-26 DIAGNOSIS — Z9889 Other specified postprocedural states: Secondary | ICD-10-CM | POA: Diagnosis not present

## 2023-05-26 DIAGNOSIS — M2560 Stiffness of unspecified joint, not elsewhere classified: Secondary | ICD-10-CM | POA: Diagnosis not present

## 2023-05-26 DIAGNOSIS — S143XXD Injury of brachial plexus, subsequent encounter: Secondary | ICD-10-CM | POA: Diagnosis not present

## 2023-05-26 DIAGNOSIS — G540 Brachial plexus disorders: Secondary | ICD-10-CM | POA: Diagnosis not present

## 2023-05-26 DIAGNOSIS — R29898 Other symptoms and signs involving the musculoskeletal system: Secondary | ICD-10-CM | POA: Diagnosis not present

## 2023-05-26 DIAGNOSIS — S143XXS Injury of brachial plexus, sequela: Secondary | ICD-10-CM | POA: Diagnosis not present

## 2023-05-26 DIAGNOSIS — M25611 Stiffness of right shoulder, not elsewhere classified: Secondary | ICD-10-CM | POA: Diagnosis not present

## 2023-05-26 DIAGNOSIS — M25631 Stiffness of right wrist, not elsewhere classified: Secondary | ICD-10-CM | POA: Diagnosis not present

## 2023-05-26 DIAGNOSIS — Z789 Other specified health status: Secondary | ICD-10-CM | POA: Diagnosis not present

## 2023-05-26 DIAGNOSIS — Z4889 Encounter for other specified surgical aftercare: Secondary | ICD-10-CM | POA: Diagnosis not present

## 2023-05-26 DIAGNOSIS — M25621 Stiffness of right elbow, not elsewhere classified: Secondary | ICD-10-CM | POA: Diagnosis not present

## 2023-05-31 ENCOUNTER — Ambulatory Visit: Payer: 59 | Admitting: Family Medicine

## 2023-06-07 DIAGNOSIS — S143XXD Injury of brachial plexus, subsequent encounter: Secondary | ICD-10-CM | POA: Diagnosis not present

## 2023-06-07 DIAGNOSIS — S143XXS Injury of brachial plexus, sequela: Secondary | ICD-10-CM | POA: Diagnosis not present

## 2023-06-22 DIAGNOSIS — S143XXD Injury of brachial plexus, subsequent encounter: Secondary | ICD-10-CM | POA: Diagnosis not present

## 2023-06-22 DIAGNOSIS — Z4889 Encounter for other specified surgical aftercare: Secondary | ICD-10-CM | POA: Diagnosis not present

## 2023-06-22 DIAGNOSIS — M25621 Stiffness of right elbow, not elsewhere classified: Secondary | ICD-10-CM | POA: Diagnosis not present

## 2023-06-22 DIAGNOSIS — M2560 Stiffness of unspecified joint, not elsewhere classified: Secondary | ICD-10-CM | POA: Diagnosis not present

## 2023-06-22 DIAGNOSIS — S143XXS Injury of brachial plexus, sequela: Secondary | ICD-10-CM | POA: Diagnosis not present

## 2023-06-22 DIAGNOSIS — M25631 Stiffness of right wrist, not elsewhere classified: Secondary | ICD-10-CM | POA: Diagnosis not present

## 2023-06-22 DIAGNOSIS — R29898 Other symptoms and signs involving the musculoskeletal system: Secondary | ICD-10-CM | POA: Diagnosis not present

## 2023-06-22 DIAGNOSIS — Z789 Other specified health status: Secondary | ICD-10-CM | POA: Diagnosis not present

## 2023-06-22 DIAGNOSIS — G540 Brachial plexus disorders: Secondary | ICD-10-CM | POA: Diagnosis not present

## 2023-06-22 DIAGNOSIS — Z9889 Other specified postprocedural states: Secondary | ICD-10-CM | POA: Diagnosis not present

## 2023-06-22 DIAGNOSIS — M25611 Stiffness of right shoulder, not elsewhere classified: Secondary | ICD-10-CM | POA: Diagnosis not present

## 2023-07-05 ENCOUNTER — Ambulatory Visit (INDEPENDENT_AMBULATORY_CARE_PROVIDER_SITE_OTHER): Admitting: Nurse Practitioner

## 2023-07-05 ENCOUNTER — Encounter: Payer: Self-pay | Admitting: Nurse Practitioner

## 2023-07-05 VITALS — BP 102/68 | HR 71 | Temp 98.2°F | Ht 74.0 in | Wt 229.0 lb

## 2023-07-05 DIAGNOSIS — Z0001 Encounter for general adult medical examination with abnormal findings: Secondary | ICD-10-CM

## 2023-07-05 DIAGNOSIS — R634 Abnormal weight loss: Secondary | ICD-10-CM | POA: Diagnosis not present

## 2023-07-05 DIAGNOSIS — F17200 Nicotine dependence, unspecified, uncomplicated: Secondary | ICD-10-CM

## 2023-07-05 DIAGNOSIS — G8921 Chronic pain due to trauma: Secondary | ICD-10-CM | POA: Diagnosis not present

## 2023-07-05 DIAGNOSIS — Z789 Other specified health status: Secondary | ICD-10-CM

## 2023-07-05 DIAGNOSIS — Z13220 Encounter for screening for lipoid disorders: Secondary | ICD-10-CM

## 2023-07-05 DIAGNOSIS — S143XXS Injury of brachial plexus, sequela: Secondary | ICD-10-CM

## 2023-07-05 DIAGNOSIS — Z113 Encounter for screening for infections with a predominantly sexual mode of transmission: Secondary | ICD-10-CM

## 2023-07-05 DIAGNOSIS — Z0000 Encounter for general adult medical examination without abnormal findings: Secondary | ICD-10-CM

## 2023-07-05 DIAGNOSIS — R5383 Other fatigue: Secondary | ICD-10-CM | POA: Diagnosis not present

## 2023-07-06 ENCOUNTER — Telehealth: Payer: Self-pay

## 2023-07-06 ENCOUNTER — Ambulatory Visit: Payer: Self-pay | Admitting: Family Medicine

## 2023-07-06 ENCOUNTER — Encounter: Payer: Self-pay | Admitting: Nurse Practitioner

## 2023-07-06 DIAGNOSIS — Z789 Other specified health status: Secondary | ICD-10-CM | POA: Insufficient documentation

## 2023-07-06 LAB — COMPREHENSIVE METABOLIC PANEL
ALT: 16 IU/L (ref 0–44)
AST: 20 IU/L (ref 0–40)
Albumin: 4.6 g/dL (ref 4.1–5.1)
Alkaline Phosphatase: 152 IU/L — ABNORMAL HIGH (ref 44–121)
BUN/Creatinine Ratio: 10 (ref 9–20)
BUN: 11 mg/dL (ref 6–20)
Bilirubin Total: 0.3 mg/dL (ref 0.0–1.2)
CO2: 21 mmol/L (ref 20–29)
Calcium: 9.9 mg/dL (ref 8.7–10.2)
Chloride: 104 mmol/L (ref 96–106)
Creatinine, Ser: 1.06 mg/dL (ref 0.76–1.27)
Globulin, Total: 2.9 g/dL (ref 1.5–4.5)
Glucose: 76 mg/dL (ref 70–99)
Potassium: 5.5 mmol/L — ABNORMAL HIGH (ref 3.5–5.2)
Sodium: 141 mmol/L (ref 134–144)
Total Protein: 7.5 g/dL (ref 6.0–8.5)
eGFR: 93 mL/min/{1.73_m2} (ref >59)

## 2023-07-06 LAB — LIPID PANEL
Chol/HDL Ratio: 3.8 ratio (ref 0.0–5.0)
Cholesterol, Total: 159 mg/dL (ref 100–199)
HDL: 42 mg/dL (ref >39)
LDL Chol Calc (NIH): 94 mg/dL (ref 0–99)
Triglycerides: 129 mg/dL (ref 0–149)
VLDL Cholesterol Cal: 23 mg/dL (ref 5–40)

## 2023-07-06 LAB — TSH: TSH: 0.641 u[IU]/mL (ref 0.450–4.500)

## 2023-07-06 NOTE — Telephone Encounter (Signed)
 [email or web address]

## 2023-07-06 NOTE — Telephone Encounter (Signed)
 Pt returning call from earlier. Per note in chart:  ----- Message from Campbell Riches sent at 07/06/2023  9:23 AM EDT ----- Please call this am. His potassium was high which can be serious especially if it continues to go up. He has had a very low potassium in the past. Is he taking a potassium supplement? Any excessive intake of potassium in his diet? Thanks,      Patient denies taking a potassium supplement or excessive potassium in diet. Reason for Disposition  Health Information question, no triage required and triager able to answer question  Answer Assessment - Initial Assessment Questions 1. REASON FOR CALL or QUESTION: "What is your reason for calling today?" or "How can I best help you?" or "What question do you have that I can help answer?"     Results  Protocols used: Information Only Call - No Triage-A-AH

## 2023-07-06 NOTE — Telephone Encounter (Signed)
LMTCB to discuss lab results.
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
## 2023-07-06 NOTE — Telephone Encounter (Signed)
-----   Message from Campbell Riches sent at 07/06/2023  9:23 AM EDT ----- Please call this am. His potassium was high which can be serious especially if it continues to go up. He has had a very low potassium in the past. Is he taking a potassium supplement? Any excessive intake of potassium in his diet? Thanks,

## 2023-07-07 ENCOUNTER — Other Ambulatory Visit: Payer: Self-pay | Admitting: Nurse Practitioner

## 2023-07-07 DIAGNOSIS — E875 Hyperkalemia: Secondary | ICD-10-CM

## 2023-07-07 LAB — SPECIMEN STATUS REPORT

## 2023-07-07 LAB — CHLAMYDIA/GONOCOCCUS/TRICHOMONAS, NAA
Chlamydia by NAA: NEGATIVE
Gonococcus by NAA: NEGATIVE
Trich vag by NAA: NEGATIVE

## 2023-07-07 NOTE — Telephone Encounter (Signed)
 Spoke with patient regarding high potassium levels, and he states he has been treated for bone injury and has been taking protein shakes due to he was advised he should take in a lot of protein to help heal and strengthen his bones and muscles.

## 2023-07-07 NOTE — Telephone Encounter (Signed)
 Left a message for the patient with the mom to let him know to have labs done this week.

## 2023-07-08 ENCOUNTER — Telehealth: Payer: Self-pay

## 2023-07-08 NOTE — Telephone Encounter (Signed)
 Patient dropped off document FL2, to be filled out by provider. Patient requested to send it back via Call Patient to pick up within 7-days. Document is located in providers tray at front office.Please advise at Mobile [phone] (mobile)

## 2023-07-12 NOTE — Telephone Encounter (Signed)
 Called and left a message for a return call to inform per lab requests

## 2023-07-13 DIAGNOSIS — S143XXD Injury of brachial plexus, subsequent encounter: Secondary | ICD-10-CM | POA: Diagnosis not present

## 2023-07-13 DIAGNOSIS — G540 Brachial plexus disorders: Secondary | ICD-10-CM | POA: Diagnosis not present

## 2023-07-13 DIAGNOSIS — M2560 Stiffness of unspecified joint, not elsewhere classified: Secondary | ICD-10-CM | POA: Diagnosis not present

## 2023-07-13 DIAGNOSIS — M25631 Stiffness of right wrist, not elsewhere classified: Secondary | ICD-10-CM | POA: Diagnosis not present

## 2023-07-13 DIAGNOSIS — Z9889 Other specified postprocedural states: Secondary | ICD-10-CM | POA: Diagnosis not present

## 2023-07-13 DIAGNOSIS — M25621 Stiffness of right elbow, not elsewhere classified: Secondary | ICD-10-CM | POA: Diagnosis not present

## 2023-07-13 DIAGNOSIS — M25611 Stiffness of right shoulder, not elsewhere classified: Secondary | ICD-10-CM | POA: Diagnosis not present

## 2023-07-13 DIAGNOSIS — S143XXS Injury of brachial plexus, sequela: Secondary | ICD-10-CM | POA: Diagnosis not present

## 2023-07-13 DIAGNOSIS — Z789 Other specified health status: Secondary | ICD-10-CM | POA: Diagnosis not present

## 2023-07-13 DIAGNOSIS — R29898 Other symptoms and signs involving the musculoskeletal system: Secondary | ICD-10-CM | POA: Diagnosis not present

## 2023-07-13 DIAGNOSIS — Z4889 Encounter for other specified surgical aftercare: Secondary | ICD-10-CM | POA: Diagnosis not present

## 2023-07-21 DIAGNOSIS — M2560 Stiffness of unspecified joint, not elsewhere classified: Secondary | ICD-10-CM | POA: Diagnosis not present

## 2023-07-21 DIAGNOSIS — S143XXD Injury of brachial plexus, subsequent encounter: Secondary | ICD-10-CM | POA: Diagnosis not present

## 2023-07-21 DIAGNOSIS — M25611 Stiffness of right shoulder, not elsewhere classified: Secondary | ICD-10-CM | POA: Diagnosis not present

## 2023-07-21 DIAGNOSIS — Z789 Other specified health status: Secondary | ICD-10-CM | POA: Diagnosis not present

## 2023-07-21 DIAGNOSIS — S143XXS Injury of brachial plexus, sequela: Secondary | ICD-10-CM | POA: Diagnosis not present

## 2023-07-21 DIAGNOSIS — Z4889 Encounter for other specified surgical aftercare: Secondary | ICD-10-CM | POA: Diagnosis not present

## 2023-07-21 DIAGNOSIS — M25621 Stiffness of right elbow, not elsewhere classified: Secondary | ICD-10-CM | POA: Diagnosis not present

## 2023-07-21 DIAGNOSIS — G540 Brachial plexus disorders: Secondary | ICD-10-CM | POA: Diagnosis not present

## 2023-07-21 DIAGNOSIS — R29898 Other symptoms and signs involving the musculoskeletal system: Secondary | ICD-10-CM | POA: Diagnosis not present

## 2023-07-21 DIAGNOSIS — Z9889 Other specified postprocedural states: Secondary | ICD-10-CM | POA: Diagnosis not present

## 2023-07-21 DIAGNOSIS — M25631 Stiffness of right wrist, not elsewhere classified: Secondary | ICD-10-CM | POA: Diagnosis not present

## 2023-07-28 DIAGNOSIS — S143XXD Injury of brachial plexus, subsequent encounter: Secondary | ICD-10-CM | POA: Diagnosis not present

## 2023-07-28 DIAGNOSIS — S143XXS Injury of brachial plexus, sequela: Secondary | ICD-10-CM | POA: Diagnosis not present

## 2023-07-28 DIAGNOSIS — M25311 Other instability, right shoulder: Secondary | ICD-10-CM | POA: Diagnosis not present

## 2023-08-04 DIAGNOSIS — G540 Brachial plexus disorders: Secondary | ICD-10-CM | POA: Diagnosis not present

## 2023-08-04 DIAGNOSIS — Z789 Other specified health status: Secondary | ICD-10-CM | POA: Diagnosis not present

## 2023-08-04 DIAGNOSIS — M25621 Stiffness of right elbow, not elsewhere classified: Secondary | ICD-10-CM | POA: Diagnosis not present

## 2023-08-04 DIAGNOSIS — M25611 Stiffness of right shoulder, not elsewhere classified: Secondary | ICD-10-CM | POA: Diagnosis not present

## 2023-08-04 DIAGNOSIS — R29898 Other symptoms and signs involving the musculoskeletal system: Secondary | ICD-10-CM | POA: Diagnosis not present

## 2023-08-04 DIAGNOSIS — M2560 Stiffness of unspecified joint, not elsewhere classified: Secondary | ICD-10-CM | POA: Diagnosis not present

## 2023-08-04 DIAGNOSIS — M25631 Stiffness of right wrist, not elsewhere classified: Secondary | ICD-10-CM | POA: Diagnosis not present

## 2023-08-04 DIAGNOSIS — S143XXD Injury of brachial plexus, subsequent encounter: Secondary | ICD-10-CM | POA: Diagnosis not present

## 2023-08-04 DIAGNOSIS — Z9889 Other specified postprocedural states: Secondary | ICD-10-CM | POA: Diagnosis not present

## 2023-08-04 DIAGNOSIS — Z4889 Encounter for other specified surgical aftercare: Secondary | ICD-10-CM | POA: Diagnosis not present

## 2023-08-04 DIAGNOSIS — S143XXS Injury of brachial plexus, sequela: Secondary | ICD-10-CM | POA: Diagnosis not present

## 2023-08-05 ENCOUNTER — Encounter: Payer: Self-pay | Admitting: Physical Medicine and Rehabilitation

## 2023-08-10 DIAGNOSIS — G540 Brachial plexus disorders: Secondary | ICD-10-CM | POA: Diagnosis not present

## 2023-08-10 DIAGNOSIS — S143XXD Injury of brachial plexus, subsequent encounter: Secondary | ICD-10-CM | POA: Diagnosis not present

## 2023-08-10 DIAGNOSIS — R29898 Other symptoms and signs involving the musculoskeletal system: Secondary | ICD-10-CM | POA: Diagnosis not present

## 2023-08-10 DIAGNOSIS — M25621 Stiffness of right elbow, not elsewhere classified: Secondary | ICD-10-CM | POA: Diagnosis not present

## 2023-08-10 DIAGNOSIS — S143XXS Injury of brachial plexus, sequela: Secondary | ICD-10-CM | POA: Diagnosis not present

## 2023-08-10 DIAGNOSIS — Z9889 Other specified postprocedural states: Secondary | ICD-10-CM | POA: Diagnosis not present

## 2023-08-10 DIAGNOSIS — Z4889 Encounter for other specified surgical aftercare: Secondary | ICD-10-CM | POA: Diagnosis not present

## 2023-08-10 DIAGNOSIS — M25631 Stiffness of right wrist, not elsewhere classified: Secondary | ICD-10-CM | POA: Diagnosis not present

## 2023-08-10 DIAGNOSIS — M25611 Stiffness of right shoulder, not elsewhere classified: Secondary | ICD-10-CM | POA: Diagnosis not present

## 2023-08-10 DIAGNOSIS — Z789 Other specified health status: Secondary | ICD-10-CM | POA: Diagnosis not present

## 2023-08-10 DIAGNOSIS — M2560 Stiffness of unspecified joint, not elsewhere classified: Secondary | ICD-10-CM | POA: Diagnosis not present

## 2023-08-26 DIAGNOSIS — M25311 Other instability, right shoulder: Secondary | ICD-10-CM | POA: Diagnosis not present

## 2023-08-26 DIAGNOSIS — S143XXA Injury of brachial plexus, initial encounter: Secondary | ICD-10-CM | POA: Diagnosis not present

## 2023-09-07 DIAGNOSIS — Z4789 Encounter for other orthopedic aftercare: Secondary | ICD-10-CM | POA: Diagnosis not present

## 2023-09-30 DIAGNOSIS — S143XXS Injury of brachial plexus, sequela: Secondary | ICD-10-CM | POA: Diagnosis not present

## 2023-09-30 DIAGNOSIS — M25621 Stiffness of right elbow, not elsewhere classified: Secondary | ICD-10-CM | POA: Diagnosis not present

## 2023-09-30 DIAGNOSIS — S143XXD Injury of brachial plexus, subsequent encounter: Secondary | ICD-10-CM | POA: Diagnosis not present

## 2023-09-30 DIAGNOSIS — M2560 Stiffness of unspecified joint, not elsewhere classified: Secondary | ICD-10-CM | POA: Diagnosis not present

## 2023-09-30 DIAGNOSIS — M25611 Stiffness of right shoulder, not elsewhere classified: Secondary | ICD-10-CM | POA: Diagnosis not present

## 2023-09-30 DIAGNOSIS — Z7409 Other reduced mobility: Secondary | ICD-10-CM | POA: Diagnosis not present

## 2023-09-30 DIAGNOSIS — Z9889 Other specified postprocedural states: Secondary | ICD-10-CM | POA: Diagnosis not present

## 2023-09-30 DIAGNOSIS — R29898 Other symptoms and signs involving the musculoskeletal system: Secondary | ICD-10-CM | POA: Diagnosis not present

## 2023-09-30 DIAGNOSIS — Z789 Other specified health status: Secondary | ICD-10-CM | POA: Diagnosis not present

## 2023-09-30 DIAGNOSIS — G540 Brachial plexus disorders: Secondary | ICD-10-CM | POA: Diagnosis not present

## 2023-09-30 DIAGNOSIS — Z4789 Encounter for other orthopedic aftercare: Secondary | ICD-10-CM | POA: Diagnosis not present

## 2023-09-30 DIAGNOSIS — Z4889 Encounter for other specified surgical aftercare: Secondary | ICD-10-CM | POA: Diagnosis not present

## 2023-09-30 DIAGNOSIS — M25631 Stiffness of right wrist, not elsewhere classified: Secondary | ICD-10-CM | POA: Diagnosis not present

## 2023-10-04 ENCOUNTER — Encounter: Admitting: Physical Medicine and Rehabilitation

## 2023-10-13 DIAGNOSIS — R29898 Other symptoms and signs involving the musculoskeletal system: Secondary | ICD-10-CM | POA: Diagnosis not present

## 2023-10-13 DIAGNOSIS — Z4789 Encounter for other orthopedic aftercare: Secondary | ICD-10-CM | POA: Diagnosis not present

## 2023-10-13 DIAGNOSIS — G540 Brachial plexus disorders: Secondary | ICD-10-CM | POA: Diagnosis not present

## 2023-10-13 DIAGNOSIS — M25631 Stiffness of right wrist, not elsewhere classified: Secondary | ICD-10-CM | POA: Diagnosis not present

## 2023-10-13 DIAGNOSIS — Z4889 Encounter for other specified surgical aftercare: Secondary | ICD-10-CM | POA: Diagnosis not present

## 2023-10-13 DIAGNOSIS — Z789 Other specified health status: Secondary | ICD-10-CM | POA: Diagnosis not present

## 2023-10-13 DIAGNOSIS — Z7409 Other reduced mobility: Secondary | ICD-10-CM | POA: Diagnosis not present

## 2023-10-13 DIAGNOSIS — M25621 Stiffness of right elbow, not elsewhere classified: Secondary | ICD-10-CM | POA: Diagnosis not present

## 2023-10-13 DIAGNOSIS — Z9889 Other specified postprocedural states: Secondary | ICD-10-CM | POA: Diagnosis not present

## 2023-10-13 DIAGNOSIS — S143XXD Injury of brachial plexus, subsequent encounter: Secondary | ICD-10-CM | POA: Diagnosis not present

## 2023-10-13 DIAGNOSIS — M25611 Stiffness of right shoulder, not elsewhere classified: Secondary | ICD-10-CM | POA: Diagnosis not present

## 2023-10-13 DIAGNOSIS — S143XXS Injury of brachial plexus, sequela: Secondary | ICD-10-CM | POA: Diagnosis not present

## 2023-10-13 DIAGNOSIS — M2560 Stiffness of unspecified joint, not elsewhere classified: Secondary | ICD-10-CM | POA: Diagnosis not present

## 2023-10-25 DIAGNOSIS — Z4889 Encounter for other specified surgical aftercare: Secondary | ICD-10-CM | POA: Diagnosis not present

## 2023-10-25 DIAGNOSIS — M25611 Stiffness of right shoulder, not elsewhere classified: Secondary | ICD-10-CM | POA: Diagnosis not present

## 2023-10-25 DIAGNOSIS — M25621 Stiffness of right elbow, not elsewhere classified: Secondary | ICD-10-CM | POA: Diagnosis not present

## 2023-10-25 DIAGNOSIS — M25631 Stiffness of right wrist, not elsewhere classified: Secondary | ICD-10-CM | POA: Diagnosis not present

## 2023-10-25 DIAGNOSIS — R29898 Other symptoms and signs involving the musculoskeletal system: Secondary | ICD-10-CM | POA: Diagnosis not present

## 2023-10-25 DIAGNOSIS — S143XXD Injury of brachial plexus, subsequent encounter: Secondary | ICD-10-CM | POA: Diagnosis not present

## 2023-10-25 DIAGNOSIS — Z4789 Encounter for other orthopedic aftercare: Secondary | ICD-10-CM | POA: Diagnosis not present

## 2023-10-25 DIAGNOSIS — Z7409 Other reduced mobility: Secondary | ICD-10-CM | POA: Diagnosis not present

## 2023-10-25 DIAGNOSIS — M2560 Stiffness of unspecified joint, not elsewhere classified: Secondary | ICD-10-CM | POA: Diagnosis not present

## 2023-10-25 DIAGNOSIS — G540 Brachial plexus disorders: Secondary | ICD-10-CM | POA: Diagnosis not present

## 2023-10-25 DIAGNOSIS — Z789 Other specified health status: Secondary | ICD-10-CM | POA: Diagnosis not present

## 2023-10-25 DIAGNOSIS — S143XXS Injury of brachial plexus, sequela: Secondary | ICD-10-CM | POA: Diagnosis not present

## 2023-10-25 DIAGNOSIS — Z9889 Other specified postprocedural states: Secondary | ICD-10-CM | POA: Diagnosis not present

## 2023-11-10 DIAGNOSIS — Z9889 Other specified postprocedural states: Secondary | ICD-10-CM | POA: Diagnosis not present

## 2023-11-10 DIAGNOSIS — Z7409 Other reduced mobility: Secondary | ICD-10-CM | POA: Diagnosis not present

## 2023-11-10 DIAGNOSIS — G540 Brachial plexus disorders: Secondary | ICD-10-CM | POA: Diagnosis not present

## 2023-11-10 DIAGNOSIS — M25631 Stiffness of right wrist, not elsewhere classified: Secondary | ICD-10-CM | POA: Diagnosis not present

## 2023-11-10 DIAGNOSIS — R29898 Other symptoms and signs involving the musculoskeletal system: Secondary | ICD-10-CM | POA: Diagnosis not present

## 2023-11-10 DIAGNOSIS — M2560 Stiffness of unspecified joint, not elsewhere classified: Secondary | ICD-10-CM | POA: Diagnosis not present

## 2023-11-10 DIAGNOSIS — M25611 Stiffness of right shoulder, not elsewhere classified: Secondary | ICD-10-CM | POA: Diagnosis not present

## 2023-11-10 DIAGNOSIS — Z789 Other specified health status: Secondary | ICD-10-CM | POA: Diagnosis not present

## 2023-11-10 DIAGNOSIS — Z4789 Encounter for other orthopedic aftercare: Secondary | ICD-10-CM | POA: Diagnosis not present

## 2023-11-10 DIAGNOSIS — Z4889 Encounter for other specified surgical aftercare: Secondary | ICD-10-CM | POA: Diagnosis not present

## 2023-11-10 DIAGNOSIS — S143XXD Injury of brachial plexus, subsequent encounter: Secondary | ICD-10-CM | POA: Diagnosis not present

## 2023-11-10 DIAGNOSIS — M25621 Stiffness of right elbow, not elsewhere classified: Secondary | ICD-10-CM | POA: Diagnosis not present

## 2023-11-10 DIAGNOSIS — S143XXS Injury of brachial plexus, sequela: Secondary | ICD-10-CM | POA: Diagnosis not present

## 2023-11-16 DIAGNOSIS — Z7409 Other reduced mobility: Secondary | ICD-10-CM | POA: Diagnosis not present

## 2023-11-16 DIAGNOSIS — Z9889 Other specified postprocedural states: Secondary | ICD-10-CM | POA: Diagnosis not present

## 2023-11-16 DIAGNOSIS — M2560 Stiffness of unspecified joint, not elsewhere classified: Secondary | ICD-10-CM | POA: Diagnosis not present

## 2023-11-16 DIAGNOSIS — S143XXS Injury of brachial plexus, sequela: Secondary | ICD-10-CM | POA: Diagnosis not present

## 2023-11-16 DIAGNOSIS — Z4889 Encounter for other specified surgical aftercare: Secondary | ICD-10-CM | POA: Diagnosis not present

## 2023-11-16 DIAGNOSIS — Z789 Other specified health status: Secondary | ICD-10-CM | POA: Diagnosis not present

## 2023-11-16 DIAGNOSIS — M25621 Stiffness of right elbow, not elsewhere classified: Secondary | ICD-10-CM | POA: Diagnosis not present

## 2023-11-16 DIAGNOSIS — Z4789 Encounter for other orthopedic aftercare: Secondary | ICD-10-CM | POA: Diagnosis not present

## 2023-11-16 DIAGNOSIS — S143XXD Injury of brachial plexus, subsequent encounter: Secondary | ICD-10-CM | POA: Diagnosis not present

## 2023-11-16 DIAGNOSIS — R29898 Other symptoms and signs involving the musculoskeletal system: Secondary | ICD-10-CM | POA: Diagnosis not present

## 2023-11-16 DIAGNOSIS — M25611 Stiffness of right shoulder, not elsewhere classified: Secondary | ICD-10-CM | POA: Diagnosis not present

## 2023-11-16 DIAGNOSIS — G540 Brachial plexus disorders: Secondary | ICD-10-CM | POA: Diagnosis not present

## 2023-11-16 DIAGNOSIS — M25631 Stiffness of right wrist, not elsewhere classified: Secondary | ICD-10-CM | POA: Diagnosis not present

## 2023-11-22 DIAGNOSIS — Z4789 Encounter for other orthopedic aftercare: Secondary | ICD-10-CM | POA: Diagnosis not present

## 2023-11-22 DIAGNOSIS — M25621 Stiffness of right elbow, not elsewhere classified: Secondary | ICD-10-CM | POA: Diagnosis not present

## 2023-11-22 DIAGNOSIS — S143XXS Injury of brachial plexus, sequela: Secondary | ICD-10-CM | POA: Diagnosis not present

## 2023-11-22 DIAGNOSIS — Z9889 Other specified postprocedural states: Secondary | ICD-10-CM | POA: Diagnosis not present

## 2023-11-22 DIAGNOSIS — S143XXD Injury of brachial plexus, subsequent encounter: Secondary | ICD-10-CM | POA: Diagnosis not present

## 2023-11-22 DIAGNOSIS — R29898 Other symptoms and signs involving the musculoskeletal system: Secondary | ICD-10-CM | POA: Diagnosis not present

## 2023-11-22 DIAGNOSIS — Z4889 Encounter for other specified surgical aftercare: Secondary | ICD-10-CM | POA: Diagnosis not present

## 2023-11-22 DIAGNOSIS — M25611 Stiffness of right shoulder, not elsewhere classified: Secondary | ICD-10-CM | POA: Diagnosis not present

## 2023-11-22 DIAGNOSIS — G540 Brachial plexus disorders: Secondary | ICD-10-CM | POA: Diagnosis not present

## 2023-11-22 DIAGNOSIS — Z7409 Other reduced mobility: Secondary | ICD-10-CM | POA: Diagnosis not present

## 2023-11-22 DIAGNOSIS — M2560 Stiffness of unspecified joint, not elsewhere classified: Secondary | ICD-10-CM | POA: Diagnosis not present

## 2023-11-22 DIAGNOSIS — Z789 Other specified health status: Secondary | ICD-10-CM | POA: Diagnosis not present

## 2023-11-22 DIAGNOSIS — M25631 Stiffness of right wrist, not elsewhere classified: Secondary | ICD-10-CM | POA: Diagnosis not present

## 2023-11-25 ENCOUNTER — Ambulatory Visit: Admitting: Physical Medicine & Rehabilitation

## 2023-12-06 DIAGNOSIS — S143XXD Injury of brachial plexus, subsequent encounter: Secondary | ICD-10-CM | POA: Diagnosis not present

## 2023-12-06 DIAGNOSIS — S143XXS Injury of brachial plexus, sequela: Secondary | ICD-10-CM | POA: Diagnosis not present

## 2023-12-06 DIAGNOSIS — M25311 Other instability, right shoulder: Secondary | ICD-10-CM | POA: Diagnosis not present

## 2023-12-16 DIAGNOSIS — Z789 Other specified health status: Secondary | ICD-10-CM | POA: Diagnosis not present

## 2023-12-16 DIAGNOSIS — M25611 Stiffness of right shoulder, not elsewhere classified: Secondary | ICD-10-CM | POA: Diagnosis not present

## 2023-12-16 DIAGNOSIS — G540 Brachial plexus disorders: Secondary | ICD-10-CM | POA: Diagnosis not present

## 2023-12-16 DIAGNOSIS — R29898 Other symptoms and signs involving the musculoskeletal system: Secondary | ICD-10-CM | POA: Diagnosis not present

## 2023-12-16 DIAGNOSIS — S143XXD Injury of brachial plexus, subsequent encounter: Secondary | ICD-10-CM | POA: Diagnosis not present

## 2023-12-16 DIAGNOSIS — M2560 Stiffness of unspecified joint, not elsewhere classified: Secondary | ICD-10-CM | POA: Diagnosis not present

## 2023-12-16 DIAGNOSIS — M25631 Stiffness of right wrist, not elsewhere classified: Secondary | ICD-10-CM | POA: Diagnosis not present

## 2023-12-16 DIAGNOSIS — S143XXS Injury of brachial plexus, sequela: Secondary | ICD-10-CM | POA: Diagnosis not present

## 2023-12-16 DIAGNOSIS — Z4889 Encounter for other specified surgical aftercare: Secondary | ICD-10-CM | POA: Diagnosis not present

## 2023-12-16 DIAGNOSIS — M25621 Stiffness of right elbow, not elsewhere classified: Secondary | ICD-10-CM | POA: Diagnosis not present

## 2023-12-16 DIAGNOSIS — Z7409 Other reduced mobility: Secondary | ICD-10-CM | POA: Diagnosis not present

## 2023-12-16 DIAGNOSIS — Z4789 Encounter for other orthopedic aftercare: Secondary | ICD-10-CM | POA: Diagnosis not present

## 2023-12-23 DIAGNOSIS — M25631 Stiffness of right wrist, not elsewhere classified: Secondary | ICD-10-CM | POA: Diagnosis not present

## 2023-12-23 DIAGNOSIS — G540 Brachial plexus disorders: Secondary | ICD-10-CM | POA: Diagnosis not present

## 2023-12-23 DIAGNOSIS — Z9889 Other specified postprocedural states: Secondary | ICD-10-CM | POA: Diagnosis not present

## 2023-12-23 DIAGNOSIS — Z7409 Other reduced mobility: Secondary | ICD-10-CM | POA: Diagnosis not present

## 2023-12-23 DIAGNOSIS — M2560 Stiffness of unspecified joint, not elsewhere classified: Secondary | ICD-10-CM | POA: Diagnosis not present

## 2023-12-23 DIAGNOSIS — Z789 Other specified health status: Secondary | ICD-10-CM | POA: Diagnosis not present

## 2023-12-23 DIAGNOSIS — Z4789 Encounter for other orthopedic aftercare: Secondary | ICD-10-CM | POA: Diagnosis not present

## 2023-12-23 DIAGNOSIS — S143XXS Injury of brachial plexus, sequela: Secondary | ICD-10-CM | POA: Diagnosis not present

## 2023-12-23 DIAGNOSIS — M25621 Stiffness of right elbow, not elsewhere classified: Secondary | ICD-10-CM | POA: Diagnosis not present

## 2023-12-23 DIAGNOSIS — R29898 Other symptoms and signs involving the musculoskeletal system: Secondary | ICD-10-CM | POA: Diagnosis not present

## 2023-12-23 DIAGNOSIS — M25611 Stiffness of right shoulder, not elsewhere classified: Secondary | ICD-10-CM | POA: Diagnosis not present

## 2023-12-23 DIAGNOSIS — Z4889 Encounter for other specified surgical aftercare: Secondary | ICD-10-CM | POA: Diagnosis not present

## 2023-12-23 DIAGNOSIS — S143XXD Injury of brachial plexus, subsequent encounter: Secondary | ICD-10-CM | POA: Diagnosis not present

## 2023-12-27 ENCOUNTER — Encounter: Attending: Physical Medicine & Rehabilitation | Admitting: Physical Medicine & Rehabilitation

## 2023-12-27 ENCOUNTER — Encounter: Payer: Self-pay | Admitting: Physical Medicine & Rehabilitation

## 2023-12-27 VITALS — BP 102/62 | HR 80 | Ht 74.0 in | Wt 232.2 lb

## 2023-12-27 DIAGNOSIS — Z79891 Long term (current) use of opiate analgesic: Secondary | ICD-10-CM | POA: Diagnosis not present

## 2023-12-27 DIAGNOSIS — G894 Chronic pain syndrome: Secondary | ICD-10-CM | POA: Insufficient documentation

## 2023-12-27 DIAGNOSIS — F319 Bipolar disorder, unspecified: Secondary | ICD-10-CM | POA: Insufficient documentation

## 2023-12-27 DIAGNOSIS — Z5181 Encounter for therapeutic drug level monitoring: Secondary | ICD-10-CM | POA: Insufficient documentation

## 2023-12-27 DIAGNOSIS — S143XXS Injury of brachial plexus, sequela: Secondary | ICD-10-CM | POA: Insufficient documentation

## 2023-12-27 MED ORDER — PREGABALIN 75 MG PO CAPS: 75.0000 mg | ORAL_CAPSULE | Freq: Three times a day (TID) | ORAL | 3 refills | Status: AC
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
## 2023-12-30 DIAGNOSIS — Z7409 Other reduced mobility: Secondary | ICD-10-CM | POA: Diagnosis not present

## 2023-12-30 DIAGNOSIS — Z4789 Encounter for other orthopedic aftercare: Secondary | ICD-10-CM | POA: Diagnosis not present

## 2023-12-30 DIAGNOSIS — M25621 Stiffness of right elbow, not elsewhere classified: Secondary | ICD-10-CM | POA: Diagnosis not present

## 2023-12-30 DIAGNOSIS — R29898 Other symptoms and signs involving the musculoskeletal system: Secondary | ICD-10-CM | POA: Diagnosis not present

## 2023-12-30 DIAGNOSIS — M25611 Stiffness of right shoulder, not elsewhere classified: Secondary | ICD-10-CM | POA: Diagnosis not present

## 2023-12-30 DIAGNOSIS — G540 Brachial plexus disorders: Secondary | ICD-10-CM | POA: Diagnosis not present

## 2023-12-30 DIAGNOSIS — M2560 Stiffness of unspecified joint, not elsewhere classified: Secondary | ICD-10-CM | POA: Diagnosis not present

## 2023-12-30 DIAGNOSIS — S143XXD Injury of brachial plexus, subsequent encounter: Secondary | ICD-10-CM | POA: Diagnosis not present

## 2023-12-30 DIAGNOSIS — M25631 Stiffness of right wrist, not elsewhere classified: Secondary | ICD-10-CM | POA: Diagnosis not present

## 2023-12-30 DIAGNOSIS — Z9889 Other specified postprocedural states: Secondary | ICD-10-CM | POA: Diagnosis not present

## 2023-12-30 LAB — TOXASSURE SELECT,+ANTIDEPR,UR

## 2024-02-07 ENCOUNTER — Encounter: Attending: Physical Medicine & Rehabilitation | Admitting: Physical Medicine & Rehabilitation

## 2024-02-07 DIAGNOSIS — F319 Bipolar disorder, unspecified: Secondary | ICD-10-CM | POA: Insufficient documentation

## 2024-02-07 DIAGNOSIS — Z79891 Long term (current) use of opiate analgesic: Secondary | ICD-10-CM | POA: Insufficient documentation

## 2024-02-07 DIAGNOSIS — S143XXS Injury of brachial plexus, sequela: Secondary | ICD-10-CM | POA: Insufficient documentation

## 2024-02-07 DIAGNOSIS — Z5181 Encounter for therapeutic drug level monitoring: Secondary | ICD-10-CM | POA: Insufficient documentation

## 2024-02-07 DIAGNOSIS — G894 Chronic pain syndrome: Secondary | ICD-10-CM | POA: Insufficient documentation

## 2024-06-02 ENCOUNTER — Ambulatory Visit: Payer: 59
